# Patient Record
Sex: Female | Born: 1955
Health system: Southern US, Community
[De-identification: ages and names within clinical notes are randomized; demographics above are authoritative.]

## PROBLEM LIST (undated history)

## (undated) DIAGNOSIS — I10 Essential (primary) hypertension: Secondary | ICD-10-CM

## (undated) DIAGNOSIS — E669 Obesity, unspecified: Secondary | ICD-10-CM

## (undated) DIAGNOSIS — R51 Headache: Secondary | ICD-10-CM

## (undated) DIAGNOSIS — G4730 Sleep apnea, unspecified: Secondary | ICD-10-CM

## (undated) HISTORY — DX: Obesity, unspecified: E66.9

## (undated) HISTORY — DX: Essential (primary) hypertension: I10

## (undated) HISTORY — PX: COLONOSCOPY: SHX174

## (undated) HISTORY — DX: Sleep apnea, unspecified: G47.30

---

## 1997-10-24 ENCOUNTER — Inpatient Hospital Stay (HOSPITAL_COMMUNITY): Admission: AD | Admit: 1997-10-24 | Discharge: 1997-10-24 | Payer: Self-pay | Admitting: Obstetrics & Gynecology

## 1997-11-09 ENCOUNTER — Encounter: Admission: RE | Admit: 1997-11-09 | Discharge: 1997-11-09 | Payer: Self-pay | Admitting: Obstetrics & Gynecology

## 1997-12-07 ENCOUNTER — Encounter: Admission: RE | Admit: 1997-12-07 | Discharge: 1997-12-07 | Payer: Self-pay | Admitting: Obstetrics

## 1998-01-04 ENCOUNTER — Other Ambulatory Visit: Admission: RE | Admit: 1998-01-04 | Discharge: 1998-01-04 | Payer: Self-pay | Admitting: Obstetrics & Gynecology

## 1998-01-04 ENCOUNTER — Ambulatory Visit (HOSPITAL_COMMUNITY): Admission: RE | Admit: 1998-01-04 | Discharge: 1998-01-04 | Payer: Self-pay | Admitting: Obstetrics & Gynecology

## 1998-01-04 ENCOUNTER — Encounter: Admission: RE | Admit: 1998-01-04 | Discharge: 1998-01-04 | Payer: Self-pay | Admitting: Obstetrics & Gynecology

## 1998-01-11 ENCOUNTER — Encounter: Admission: RE | Admit: 1998-01-11 | Discharge: 1998-01-11 | Payer: Self-pay | Admitting: Obstetrics & Gynecology

## 1998-01-18 ENCOUNTER — Encounter: Admission: RE | Admit: 1998-01-18 | Discharge: 1998-01-18 | Payer: Self-pay | Admitting: Obstetrics & Gynecology

## 1998-01-24 ENCOUNTER — Encounter: Admission: RE | Admit: 1998-01-24 | Discharge: 1998-01-24 | Payer: Self-pay | Admitting: Internal Medicine

## 1998-02-01 ENCOUNTER — Encounter: Admission: RE | Admit: 1998-02-01 | Discharge: 1998-02-01 | Payer: Self-pay | Admitting: Obstetrics & Gynecology

## 1998-03-08 ENCOUNTER — Encounter: Admission: RE | Admit: 1998-03-08 | Discharge: 1998-03-08 | Payer: Self-pay | Admitting: Obstetrics & Gynecology

## 1998-03-15 ENCOUNTER — Encounter: Admission: RE | Admit: 1998-03-15 | Discharge: 1998-03-15 | Payer: Self-pay | Admitting: Obstetrics & Gynecology

## 1998-06-27 ENCOUNTER — Inpatient Hospital Stay (HOSPITAL_COMMUNITY): Admission: AD | Admit: 1998-06-27 | Discharge: 1998-06-27 | Payer: Self-pay | Admitting: Obstetrics & Gynecology

## 1998-07-18 ENCOUNTER — Other Ambulatory Visit: Admission: RE | Admit: 1998-07-18 | Discharge: 1998-07-18 | Payer: Self-pay | Admitting: Obstetrics and Gynecology

## 1999-02-10 ENCOUNTER — Inpatient Hospital Stay (HOSPITAL_COMMUNITY): Admission: AD | Admit: 1999-02-10 | Discharge: 1999-02-15 | Payer: Self-pay | Admitting: Obstetrics & Gynecology

## 1999-02-10 ENCOUNTER — Encounter (INDEPENDENT_AMBULATORY_CARE_PROVIDER_SITE_OTHER): Payer: Self-pay | Admitting: Specialist

## 1999-02-16 ENCOUNTER — Encounter: Admission: RE | Admit: 1999-02-16 | Discharge: 1999-05-17 | Payer: Self-pay | Admitting: Obstetrics & Gynecology

## 1999-03-16 ENCOUNTER — Other Ambulatory Visit: Admission: RE | Admit: 1999-03-16 | Discharge: 1999-03-16 | Payer: Self-pay | Admitting: Obstetrics & Gynecology

## 2000-03-27 ENCOUNTER — Other Ambulatory Visit: Admission: RE | Admit: 2000-03-27 | Discharge: 2000-03-27 | Payer: Self-pay | Admitting: Obstetrics & Gynecology

## 2000-05-12 ENCOUNTER — Inpatient Hospital Stay (HOSPITAL_COMMUNITY): Admission: AD | Admit: 2000-05-12 | Discharge: 2000-05-12 | Payer: Self-pay | Admitting: Obstetrics and Gynecology

## 2000-05-12 ENCOUNTER — Encounter: Payer: Self-pay | Admitting: Obstetrics and Gynecology

## 2000-09-11 ENCOUNTER — Encounter: Payer: Self-pay | Admitting: Internal Medicine

## 2000-09-11 ENCOUNTER — Encounter: Admission: RE | Admit: 2000-09-11 | Discharge: 2000-09-11 | Payer: Self-pay | Admitting: Internal Medicine

## 2001-04-04 ENCOUNTER — Other Ambulatory Visit: Admission: RE | Admit: 2001-04-04 | Discharge: 2001-04-04 | Payer: Self-pay | Admitting: Obstetrics & Gynecology

## 2001-09-24 ENCOUNTER — Encounter: Payer: Self-pay | Admitting: Internal Medicine

## 2001-09-24 ENCOUNTER — Encounter: Admission: RE | Admit: 2001-09-24 | Discharge: 2001-09-24 | Payer: Self-pay | Admitting: Internal Medicine

## 2002-04-07 ENCOUNTER — Other Ambulatory Visit: Admission: RE | Admit: 2002-04-07 | Discharge: 2002-04-07 | Payer: Self-pay | Admitting: Obstetrics & Gynecology

## 2002-10-21 ENCOUNTER — Ambulatory Visit (HOSPITAL_COMMUNITY): Admission: RE | Admit: 2002-10-21 | Discharge: 2002-10-21 | Payer: Self-pay | Admitting: Gastroenterology

## 2002-10-21 ENCOUNTER — Encounter (INDEPENDENT_AMBULATORY_CARE_PROVIDER_SITE_OTHER): Payer: Self-pay | Admitting: *Deleted

## 2003-04-16 ENCOUNTER — Other Ambulatory Visit: Admission: RE | Admit: 2003-04-16 | Discharge: 2003-04-16 | Payer: Self-pay | Admitting: Obstetrics & Gynecology

## 2003-10-13 ENCOUNTER — Ambulatory Visit (HOSPITAL_COMMUNITY): Admission: RE | Admit: 2003-10-13 | Discharge: 2003-10-13 | Payer: Self-pay | Admitting: Obstetrics & Gynecology

## 2003-10-27 ENCOUNTER — Encounter: Admission: RE | Admit: 2003-10-27 | Discharge: 2003-10-27 | Payer: Self-pay | Admitting: Obstetrics & Gynecology

## 2004-11-03 ENCOUNTER — Encounter: Admission: RE | Admit: 2004-11-03 | Discharge: 2004-11-03 | Payer: Self-pay | Admitting: Internal Medicine

## 2005-11-12 ENCOUNTER — Encounter: Admission: RE | Admit: 2005-11-12 | Discharge: 2005-11-12 | Payer: Self-pay | Admitting: Obstetrics & Gynecology

## 2006-11-21 ENCOUNTER — Encounter: Admission: RE | Admit: 2006-11-21 | Discharge: 2006-11-21 | Payer: Self-pay | Admitting: Obstetrics & Gynecology

## 2007-11-24 ENCOUNTER — Encounter: Admission: RE | Admit: 2007-11-24 | Discharge: 2007-11-24 | Payer: Self-pay | Admitting: Obstetrics & Gynecology

## 2007-11-26 ENCOUNTER — Encounter: Admission: RE | Admit: 2007-11-26 | Discharge: 2007-11-26 | Payer: Self-pay | Admitting: Obstetrics & Gynecology

## 2008-11-30 ENCOUNTER — Encounter: Admission: RE | Admit: 2008-11-30 | Discharge: 2008-11-30 | Payer: Self-pay | Admitting: Obstetrics & Gynecology

## 2009-12-01 ENCOUNTER — Encounter: Admission: RE | Admit: 2009-12-01 | Discharge: 2009-12-01 | Payer: Self-pay | Admitting: Obstetrics & Gynecology

## 2010-03-27 ENCOUNTER — Encounter: Payer: Self-pay | Admitting: Obstetrics & Gynecology

## 2010-07-21 NOTE — Op Note (Signed)
NAME:  Molly Johnson, Molly Johnson                             ACCOUNT NO.:  [id number]   MEDICAL RECORD NO.:  [id number]                   PATIENT TYPE:  AMB   LOCATION:  ENDO                                 FACILITY:  [facility]   PHYSICIAN:  Danise Edge, M.D.                DATE OF BIRTH:  Feb 07, 1956   DATE OF PROCEDURE:  10/21/2002  DATE OF DISCHARGE:                                 OPERATIVE REPORT   PROCEDURES:  1. Esophagogastroduodenoscopy.  2. Colonoscopy.  3. Polypectomy.   PROCEDURE INDICATION:  Ms. Molly Johnson is a 55 year old female, born 04-26-1955.  [name] has iron deficiency anemia.   ENDOSCOPIST:  Danise Edge, M.D.   PREMEDICATION:  1. Versed 10 mg.  2. Demerol 50 mg.   PROCEDURE:  Esophagogastroduodenoscopy.   DESCRIPTION OF PROCEDURE:  After obtaining informed consent, [name] was  placed in the left lateral decubitus position.  I administered intravenous  Demerol and intravenous Versed to achieve conscious sedation for the  procedure.  The patient's blood pressure, oxygen saturation, and cardiac  rhythm were monitored throughout the procedure and documented in the medical  record.   The Olympus gastroscope was passed through the posterior hypopharynx into  the proximal esophagus without difficulty.  The hypopharynx, larynx, and  vocal cords appeared normal.   ESOPHAGOSCOPY:  The proximal, mid, and lower segments of the esophageal  mucosa appear completely normal.   GASTROSCOPY:  Retroflexed view of the gastric cardia and fundus was normal.  The gastric body, antrum, and pylorus appear normal endoscopically.   DUODENOSCOPY:  The duodenal bulb, mid duodenum, and distal duodenum appeared  normal.   ASSESSMENT:  Normal esophagogastroduodenoscopy.   PROCEDURE:  Colonoscopy.   Anal inspection was normal.  Digital rectal exam was normal.  The Olympus  adjustable pediatric colonoscope was introduced into the rectum and advanced  to the cecum.  A  normal-appearing ileocecal valve was intubated and the  distal ileum inspected.  Colonic preparation for the exam today was  excellent.   RECTUM:  Normal.  SIGMOID COLON AND DESCENDING COLON:  At 50 cm from the anal verge, a 2 mm  sessile polyp was removed with the electrocautery, snared, and submitted for  pathological interpretation.  SPLENIC FLEXURE:  Normal.  TRANSVERSE COLON:  Normal.  HEPATIC FLEXURE:  Normal.  ASCENDING COLON:  Normal.  CECUM AND ILEOCECAL VALVE:  Normal.  DISTAL ILEUM:  Normal.   ASSESSMENT:  1. A small polyp was removed from the left colon at 50 cm from the anal     verge and submitted for pathological interpretation.  2. Otherwise, normal proctocolonoscopy to the cecum.   RECOMMENDATIONS:  I suspect [name] has iron deficiency anemia due to  menses.  I recommend placing her on iron and obtaining a serum ferritin in  approximately three months.  Danise Edge, M.D.    MJ/MEDQ  D:  10/21/2002  T:  10/21/2002  Job:  [id number]   cc:   Georgann Housekeeper, M.D.  [address]  [place]  [place] [address]  Fax: [phone]

## 2010-07-21 NOTE — Op Note (Signed)
[facility]  Patient:    Molly Johnson                           MRN: [id number] Proc. Date: 02/12/99 Adm. Date:  [id number] Attending:  Genia Del                           Operative Report  DATE OF BIRTH:                June 15, 1955  PREOPERATIVE DIAGNOSIS:       Forty weeks, group B Streptococcus positive,                               suspicion of macrosomia, pregnancy-induced                               hypertension, failure to progress with probable                               fetopelvic disproportion, chorioamnionitis.  POSTOPERATIVE DIAGNOSIS:      Forty weeks, group B Streptococcus positive,                               suspicion of macrosomia, pregnancy-induced                               hypertension, failure to progress with probable                               fetopelvic disproportion, chorioamnionitis plus                               bilateral ovarian cysts.  OPERATION:                    Under epidural, primary urgent low transverse                               C-section.  SURGEON:                      Genia Del, M.D.  ASSISTANT:                    Pershing Cox, M.D.  ANESTHETIST:                  Raul Del, M.D.  ANESTHESIA:                   Epidural.  DESCRIPTION OF PROCEDURE:     Under epidural with the patient in 15 degree left  decubitus position, the patient was prepped with Betadine in the abdominal, suprapubic and vulvar areas.  The bladder catheter ws in place and the drapes were withdrawn as usual.  We verified the epidural.  The level was good.  We did a Pfannenstiel incision with the scalpel.  We opened the aponeurosis transversely  with the Mayo scissors  and then removed the aponeurosis from the recti muscles superiorly and inferiorly.  We then opened the peritoneum longitudinally with the Metzenbaum scissors.  Then, the visceroperitoneum was opened  longitudinally with the Metzenbaum scissors.  The bladder was reclined downward and hysterotomy was  done in the lower uterine segment transversely.  The incision was prolonged bilaterally with the scissors.  The head was very high in the pelvis.  It was molded and a caput is present.  The baby was born at 2.  It was a female baby.  The cord was clamped and cut.  The baby was given to the neonatologist.  The Apgars were 8 and 9.  The weight was 8 pounds and 12 ounces.  Blood was taken from the  cord.  The placenta was removed manually, and revision of the uterus was done with a lap.  Pitocin was started in the IV fluids.  The uterus contracted well.  We closed the hysterotomy in one total plan by a running suture with 0 Monocryl. e then completed the hemostasis with two sutures in X with 0 Monocryl.  The hemostasis was adequate.  We verified the pericolic gutters bilaterally.  We then examined the adnexa.  The two tubes were normal.  The two ovaries looked similar with benign appearing cysts measuring 4 cm on each side.  This will be investigated in the postoperative period at the postoperative visit in 4-6 weeks from now with an ultrasound.  We then verified hemostasis on the recti muscles.  It was completed where needed with electrocautery.  We closed the aponeurosis by two _______ running sutures with 0 Vicryl.  We then achieved hemostasis on the adipose tissue with the electrocautery and reapproximated the skin with staples.  A clean dry dressing as applied on the incision.  The count of compresses and instrument was adequate x 2 and complete x 2.  Estimated blood loss was 700 cc.  No complications occurred nd the patient was transferred to the recovery room in good status.  She will be receiving clindamycin 900 mg q.8h. IV for 24 hours __________. DD:  02/12/99 TD:  02/13/99 Job: [id number]

## 2010-07-21 NOTE — Op Note (Signed)
NAME:  Molly Johnson, BERQUIST                             ACCOUNT NO.:  [id number]   MEDICAL RECORD NO.:  [id number]                   PATIENT TYPE:  AMB   LOCATION:  SDC                                  FACILITY:  [facility]   PHYSICIAN:  Genia Del, M.D.             DATE OF BIRTH:  [birth year]   DATE OF PROCEDURE:  10/13/2003  DATE OF DISCHARGE:                                 OPERATIVE REPORT   The patient came in as an outpatient surgery at [facility] on October 13, 2003.   PREOPERATIVE DIAGNOSIS:  Desire for bilateral tubal sterilization and  menorrhagia.   POSTOPERATIVE DIAGNOSIS:  Desire for bilateral tubal sterilization and  menorrhagia.  Status post uterine suspension.   OPERATION PERFORMED:  Open laparoscopy with bilateral tubal sterilization by  cauterization and NovaSure endometrial ablation.   SURGEON:  Genia Del, M.D.   ANESTHESIOLOGIST:  Quillian Quince, M.D.   ANESTHESIA:  General endotracheal.   DESCRIPTION OF PROCEDURE:  Under general anesthesia with endotracheal  intubation, the patient was in lithotomy position for operative laparoscopy.  She was prepped with Hibiclens on the abdominal suprapubic, vulvar and  vaginal areas.  The bladder was catheterized and the patient was draped as  usual.  The vaginal exam revealed an __________ uterus, normal volume, no  adnexal mass.  The speculum was introduced into the vagina and an  intrauterine cannula was put in place with one toothed tenaculum.  We  removed the speculum.  At the __________ the subcutaneous tissue at the  infraumbilical area was infiltrated with Marcaine 0.25% plain 10 mL.  We  then made an infraumbilical incision with a scalpel at the site of the  previous scar.  We opened the aponeurosis after grasping it with Kocher  clamps with Mayo scissors.  We then ran a Vicryl 0 stitch in a pursestring  fashion that level.  We then opened the parietal peritoneum bluntly with a  finger.  The Hasson was  inserted in the abdominal cavity under direct  vision.  We then inserted a laparoscope at that level.  We visualized the  abdominal pelvic cavity.  No lesions are seen in the abdominal cavity.  A  picture was taken of the liver.  We then visualized the pelvic cavity.  The  uterus was normal in appearance except for a previous uterine suspension  bringing a thick adhesion from the uterus to the anterior wall.  The round  ligaments were going to the anterior wall.  Both tubes were normal in  appearance. Both ovaries were normal in size and appearance.  No pelvic  lesion was seen.  We then because of the anterior uterine suspension, we  could not access the tubes easily and we could not put a suprapubic trocar.  We therefore, decided to insert a 5 mm trocar on the left iliac area.  We  infiltrated the subcutaneous tissue at  that level with Marcaine 0.25% plain  6 mL.  We then made a 5 mm incision with a scalpel and inserted a 5 mm  trocar under direct vision.  We used the bipolar at that level.  The tubes  were exposed on each side.  We started on the right side identifying the  fimbria well. We cauterized the right tube at about 2 cm from the cornua in  its entire width including part of the mesosalpinx.  We cauterized again,  slightly more distally and then in between.  We proceeded the same way on  left side.  Pictures were taken before and after the intervention.  A small  superficial bleeding was seen at the fundus of the uterus and this  cauterized with the bipolar.  Hemostasis was adequate.  We irrigated and  suctioned the pelvic cavity.  We removed instruments and left the trocars in  place in case any complication were to occur during the NovaSure endometrial  ablation.  We covered the abdomen with a drape.  We positioned the patient  in full lithotomy position, we inserted the speculum in the vagina.  We then  grasped the anterior lip of the cervix with a tenaculum after removing  the  cannula.  We did a paracervical block with lidocaine 1% a total of 20 mL at  4 and 8 o'clock.  We then made the measurements of the cervical length and  intrauterine cavity length.  We dilated the cervix with Hegar dilators up to  #25 without difficulty.  We completed the measurements and the testing with  the NovaSure instrument and proceeded with the endometrial ablation. The  uterine cavity length was 6.5 cm and width was 4.6 cm.  No complication and  no difficulty was encountered.  We therefore removed all instruments  vaginally.  Hemostasis was adequate. We went abdominally after changing  gloves and removed the trocars after evacuating the CO2 completely.  We  attached the Vicryl 0 suture at the aponeurosis making sure that no  abdominal content was present at that level.  We then closed the skin at the  infraumbilical incision with a Vicryl 4-0 in a subcuticular stitch.  Hemostasis was adequate.  We closed the 5 mm incision of the left iliac area  with Dermabond.  Hemostasis was good at that level as well.  Dermabond was  also used at the infraumbilical incision on top of the Vicryl subcuticular  suture.  The count of instruments and sponges was complete.  The estimated  blood loss was minimal.  No complication occurred and the patient was  transferred to recovery room in good status.                                               Genia Del, M.D.    ML/MEDQ  D:  10/13/2003  T:  10/13/2003  Job:  [id number]

## 2010-10-31 ENCOUNTER — Other Ambulatory Visit: Payer: Self-pay | Admitting: Obstetrics & Gynecology

## 2010-10-31 DIAGNOSIS — Z1231 Encounter for screening mammogram for malignant neoplasm of breast: Secondary | ICD-10-CM

## 2010-12-05 ENCOUNTER — Ambulatory Visit
Admission: RE | Admit: 2010-12-05 | Discharge: 2010-12-05 | Disposition: A | Discharge status: Home / Self Care | Payer: Self-pay | Source: Ambulatory Visit | Attending: Obstetrics & Gynecology | Admitting: Obstetrics & Gynecology

## 2010-12-05 DIAGNOSIS — Z1231 Encounter for screening mammogram for malignant neoplasm of breast: Secondary | ICD-10-CM

## 2011-11-06 ENCOUNTER — Other Ambulatory Visit: Payer: Self-pay | Admitting: Internal Medicine

## 2011-11-06 DIAGNOSIS — Z1231 Encounter for screening mammogram for malignant neoplasm of breast: Secondary | ICD-10-CM

## 2011-12-06 ENCOUNTER — Ambulatory Visit
Admission: RE | Admit: 2011-12-06 | Discharge: 2011-12-06 | Disposition: A | Discharge status: Home / Self Care | Payer: 59 | Source: Ambulatory Visit | Attending: Internal Medicine | Admitting: Internal Medicine

## 2011-12-06 DIAGNOSIS — Z1231 Encounter for screening mammogram for malignant neoplasm of breast: Secondary | ICD-10-CM

## 2011-12-17 ENCOUNTER — Encounter: Payer: Self-pay | Admitting: *Deleted

## 2011-12-17 ENCOUNTER — Encounter: Payer: 59 | Attending: Internal Medicine | Admitting: *Deleted

## 2011-12-17 DIAGNOSIS — E669 Obesity, unspecified: Secondary | ICD-10-CM | POA: Insufficient documentation

## 2011-12-17 DIAGNOSIS — Z713 Dietary counseling and surveillance: Secondary | ICD-10-CM | POA: Insufficient documentation

## 2011-12-17 DIAGNOSIS — I10 Essential (primary) hypertension: Secondary | ICD-10-CM | POA: Insufficient documentation

## 2011-12-17 NOTE — Patient Instructions (Addendum)
Plan: Aim for 3 Carb choices (45 grams) per meal +/- 1 either way Aim for 0-2 Carbs per snack if hungry Continue with appropriate meat serving sizes Consider reading food labels for total carb of foods Consider resuming WII exercises for 5-15 minutes daily

## 2011-12-17 NOTE — Progress Notes (Signed)
  Medical Nutrition Therapy:  Appt start time: 1115 end time:  1215.  Assessment:  Primary concerns today: patient here for obesity. She does not work outside the home and lives with her husband and 55 year old son. She eats out at [employer] daily for lunch, prepares the evening meal for her family. She was walking earlier this year but fell on uneven sidewalk so stopped that. She has exercised with the WII in her home but stopped when she got sore one time. Current activity is routine housework. Her husband works out often at gym at his work.   MEDICATIONS: see list   DIETARY INTAKE:  Usual eating pattern includes 3 meals and 0-2 snacks per day.  Everyday foods include good variety of all food groups.  Avoided foods include pork due to HTN, diet sodas.    24-hr recall:  B ( AM): skips occasionally, blueberry muffin, 6 oz OJ OR hot green tea with honey Snk ( AM): none  L ( PM): fast food; 2 pieces of grilled chicken, vegetable, occasionally potatoes or cole slaw, biscuit, sweet tea or regular Pepsi Snk ( PM): variety of foods; chips usually on paper towel, fresh fruit, trail mix D ( PM): meat, starch, occasionally a vegetable, water Snk ( PM): ice cream Beverages: hot tea, OJ, regular soda, sweet tea, water  Usual physical activity: house work daily, used to work out with WII at home,   Estimated energy needs: 1600 calories 180 g carbohydrates 120 g protein 44 g fat  Progress Towards Goal(s):  In progress.   Nutritional Diagnosis:  NI-1.5 Excessive energy intake As related to activity level.  As evidenced by BMI of 41.8.    Intervention:  Nutrition counseling initiated for weight loss. Discussed calorie content of macro-nutrients and taught Carb Counting to get her started in being more aware of where her calories were coming from. Also discussed reading food labels and rationale of being active every day. Plan: Aim for 3 Carb choices (45 grams) per meal +/- 1 either  way Aim for 0-2 Carbs per snack if hungry Continue with appropriate meat serving sizes Consider reading food labels for total carb of foods Consider resuming WII exercises for 5-15 minutes daily  Handouts given during visit include: Carb Counting and Food Label handouts Meal Plan Card  APPS to use including Carbscontrol for Android phone and The Seven Minute Workout  Monitoring/Evaluation:  Dietary intake, exercise, reading food labels, and body weight prn.

## 2012-05-29 ENCOUNTER — Ambulatory Visit (INDEPENDENT_AMBULATORY_CARE_PROVIDER_SITE_OTHER): Payer: Self-pay | Admitting: General Surgery

## 2012-06-03 ENCOUNTER — Encounter (INDEPENDENT_AMBULATORY_CARE_PROVIDER_SITE_OTHER): Payer: Self-pay | Admitting: General Surgery

## 2012-06-03 ENCOUNTER — Ambulatory Visit (INDEPENDENT_AMBULATORY_CARE_PROVIDER_SITE_OTHER): Payer: 59 | Admitting: General Surgery

## 2012-06-03 VITALS — BP 130/74 | HR 76 | Temp 98.0°F | Resp 18 | Ht 62.0 in | Wt 228.0 lb

## 2012-06-03 DIAGNOSIS — R1900 Intra-abdominal and pelvic swelling, mass and lump, unspecified site: Secondary | ICD-10-CM

## 2012-06-03 NOTE — Progress Notes (Signed)
Patient ID: Molly Johnson, female   DOB: August 12, 1955, 57 y.o.   MRN: [id number]  Chief Complaint  Patient presents with  . New Evaluation    cyst left hip    HPI Molly Johnson is a 57 y.o. female.  Referred by Dr Donette Larry HPI 44 yof who presents with a years history of a left flank mass that has been enlarging. This has never been infected, drained or has drained on its own.  It has been getting bigger and is now difficult to wear clothes. She comes in and would like this removed.  Past Medical History  Diagnosis Date  . Hypertension   . Obesity     Past Surgical History  Procedure Laterality Date  . Cesarean section      Family History  Problem Relation Age of Onset  . Diabetes Mother     Social History History  Substance Use Topics  . Smoking status: Former Smoker    Types: Cigarettes    Quit date: 12/16/1988  . Smokeless tobacco: Never Used  . Alcohol Use: No    Allergies  Allergen Reactions  . Aspirin Anaphylaxis    Upsets her stomach    Current Outpatient Prescriptions  Medication Sig Dispense Refill  . Cholecalciferol (VITAMIN D3) 1000 UNITS CAPS Take by mouth.      . metoprolol succinate (TOPROL-XL) 50 MG 24 hr tablet Take 50 mg by mouth daily. Take with or immediately following a meal.      . triamterene-hydrochlorothiazide (DYAZIDE) 37.5-25 MG per capsule Take 1 capsule by mouth every morning.       No current facility-administered medications for this visit.    Review of Systems Review of Systems  Constitutional: Negative for fever, chills and unexpected weight change.  HENT: Negative for hearing loss, congestion, sore throat, trouble swallowing and voice change.   Eyes: Negative for visual disturbance.  Respiratory: Negative for cough and wheezing.   Cardiovascular: Negative for chest pain, palpitations and leg swelling.  Gastrointestinal: Negative for nausea, vomiting, abdominal pain, diarrhea, constipation, blood in stool, abdominal distention and anal  bleeding.  Genitourinary: Negative for hematuria, vaginal bleeding and difficulty urinating.  Musculoskeletal: Negative for arthralgias.  Skin: Negative for rash and wound.  Neurological: Positive for headaches. Negative for seizures and syncope.  Hematological: Negative for adenopathy. Bruises/bleeds easily.  Psychiatric/Behavioral: Negative for confusion.    Blood pressure 130/74, pulse 76, temperature 98 F (36.7 C), resp. rate 18, height 5\' 2"  (1.575 m), weight 228 lb (103.42 kg).  Physical Exam Physical Exam  Vitals reviewed. Constitutional: She appears well-developed and well-nourished.  Cardiovascular: Normal rate, regular rhythm and normal heart sounds.   Pulmonary/Chest: Effort normal and breath sounds normal. She has no wheezes. She has no rales.  Abdominal:    Lymphadenopathy:    She has no cervical adenopathy.    Data Reviewed Note from PCP  Assessment    Likely left flank lipoma     Plan    This has gotten bigger and is more symptomatic.  It is difficult to wear clothes. We discussed excision with possible drain.  Risks include but not limited to bleeding, infection recurrence although unlikely.          Hina Gupta 06/03/2012, 1:27 PM

## 2012-06-27 ENCOUNTER — Encounter (HOSPITAL_COMMUNITY): Payer: Self-pay | Admitting: Pharmacy Technician

## 2012-07-02 ENCOUNTER — Ambulatory Visit (HOSPITAL_COMMUNITY)
Admission: RE | Admit: 2012-07-02 | Discharge: 2012-07-02 | Disposition: A | Discharge status: Home / Self Care | Payer: 59 | Source: Ambulatory Visit | Attending: General Surgery | Admitting: General Surgery

## 2012-07-02 ENCOUNTER — Encounter (HOSPITAL_COMMUNITY): Payer: Self-pay

## 2012-07-02 ENCOUNTER — Encounter (HOSPITAL_COMMUNITY)
Admission: RE | Admit: 2012-07-02 | Discharge: 2012-07-02 | Disposition: A | Discharge status: Home / Self Care | Payer: 59 | Source: Ambulatory Visit | Attending: General Surgery | Admitting: General Surgery

## 2012-07-02 DIAGNOSIS — Z01812 Encounter for preprocedural laboratory examination: Secondary | ICD-10-CM | POA: Insufficient documentation

## 2012-07-02 DIAGNOSIS — Z01810 Encounter for preprocedural cardiovascular examination: Secondary | ICD-10-CM | POA: Insufficient documentation

## 2012-07-02 DIAGNOSIS — Z01818 Encounter for other preprocedural examination: Secondary | ICD-10-CM | POA: Insufficient documentation

## 2012-07-02 DIAGNOSIS — I10 Essential (primary) hypertension: Secondary | ICD-10-CM | POA: Insufficient documentation

## 2012-07-02 HISTORY — DX: Headache: R51

## 2012-07-02 LAB — BASIC METABOLIC PANEL
BUN: 12 mg/dL (ref 6–23)
Chloride: 101 mEq/L (ref 96–112)
Creatinine, Ser: 0.84 mg/dL (ref 0.50–1.10)
GFR calc Af Amer: 89 mL/min — ABNORMAL LOW (ref >90)
GFR calc non Af Amer: 77 mL/min — ABNORMAL LOW (ref >90)

## 2012-07-02 LAB — CBC WITH DIFFERENTIAL/PLATELET
Basophils Absolute: 0 10*3/uL (ref 0.0–0.1)
Basophils Relative: 0 % (ref 0–1)
HCT: 39.5 % (ref 36.0–46.0)
Lymphocytes Relative: 34 % (ref 12–46)
MCHC: 33.2 g/dL (ref 30.0–36.0)
Monocytes Absolute: 0.6 10*3/uL (ref 0.1–1.0)
Neutro Abs: 2.7 10*3/uL (ref 1.7–7.7)
Neutrophils Relative %: 52 % (ref 43–77)
Platelets: 159 10*3/uL (ref 150–400)
RDW: 13 % (ref 11.5–15.5)
WBC: 5.2 10*3/uL (ref 4.0–10.5)

## 2012-07-02 LAB — SURGICAL PCR SCREEN
MRSA, PCR: NEGATIVE
Staphylococcus aureus: NEGATIVE

## 2012-07-02 NOTE — Progress Notes (Signed)
07/02/12 1011  OBSTRUCTIVE SLEEP APNEA  Have you ever been diagnosed with sleep apnea through a sleep study? No  Do you snore loudly (loud enough to be heard through closed doors)?  1  Do you often feel tired, fatigued, or sleepy during the daytime? 0  Has anyone observed you stop breathing during your sleep? 0  Do you have, or are you being treated for high blood pressure? 1  BMI more than 35 kg/m2? 1  Age over 56 years old? 1  Neck circumference greater than 40 cm/18 inches? 0  Gender: 0  Obstructive Sleep Apnea Score 4  Score 4 or greater  Results sent to PCP

## 2012-07-02 NOTE — Patient Instructions (Addendum)
20 DUSTINA SCOGGIN  07/02/2012   Your procedure is scheduled on: 07/09/12  Report to [facility] at 0600 AM.  Call this number if you have problems the morning of surgery [phone]   Remember:   Do not eat food or drink liquids After Midnight.   Do not wear jewelry, make-up or nail polish.  Do not wear lotions, powders, or perfumes. You may wear deodorant.  Do not shave 48 hours prior to surgery. Men may shave face and neck.  Do not bring valuables to the hospital.  Contacts, dentures or bridgework may not be worn into surgery.     Patients discharged the day of surgery will not be allowed to drive home.  Name and phone number of your driver: Laykin Rainone [phone]     Please read over the following fact sheets that you were given: MRSA Information.  Birdie Sons, RN  pre op nurse call if needed [phone]    FAILURE TO FOLLOW THESE INSTRUCTIONS MAY RESULT IN CANCELLATION OF YOUR SURGERY   Patient Signature: ___________________________________________

## 2012-07-08 NOTE — Anesthesia Preprocedure Evaluation (Addendum)
Anesthesia Evaluation  Patient identified by MRN, date of birth, ID band Patient awake    Reviewed: Allergy & Precautions, H&P , NPO status , Patient's Chart, lab work & pertinent test results, reviewed documented beta blocker date and time   History of Anesthesia Complications (+) MALIGNANT HYPERTHERMIA  Airway Mallampati: II TM Distance: >3 FB Neck ROM: full    Dental  (+) Missing and Dental Advisory Given Most of the front teeth are missing.  None loose:   Pulmonary neg pulmonary ROS,  breath sounds clear to auscultation  Pulmonary exam normal       Cardiovascular Exercise Tolerance: Good hypertension, Pt. on home beta blockers Rhythm:regular Rate:Normal     Neuro/Psych negative neurological ROS  negative psych ROS   GI/Hepatic negative GI ROS, Neg liver ROS,   Endo/Other  negative endocrine ROSMorbid obesity  Renal/GU negative Renal ROS  negative genitourinary   Musculoskeletal   Abdominal (+) + obese,   Peds  Hematology negative hematology ROS (+)   Anesthesia Other Findings   Reproductive/Obstetrics negative OB ROS                        Anesthesia Physical Anesthesia Plan  ASA: III  Anesthesia Plan: General   Post-op Pain Management:    Induction: Intravenous  Airway Management Planned: LMA  Additional Equipment:   Intra-op Plan:   Post-operative Plan:   Informed Consent: I have reviewed the patients History and Physical, chart, labs and discussed the procedure including the risks, benefits and alternatives for the proposed anesthesia with the patient or authorized representative who has indicated his/her understanding and acceptance.   Dental Advisory Given  Plan Discussed with: CRNA and Surgeon  Anesthesia Plan Comments:        Anesthesia Quick Evaluation

## 2012-07-09 ENCOUNTER — Ambulatory Visit (HOSPITAL_COMMUNITY)
Admission: RE | Admit: 2012-07-09 | Discharge: 2012-07-09 | Disposition: A | Discharge status: Home / Self Care | Payer: 59 | Source: Ambulatory Visit | Attending: General Surgery | Admitting: General Surgery

## 2012-07-09 ENCOUNTER — Encounter (HOSPITAL_COMMUNITY)
Admission: RE | Disposition: A | Discharge status: Home / Self Care | Payer: Self-pay | Source: Ambulatory Visit | Attending: General Surgery

## 2012-07-09 ENCOUNTER — Ambulatory Visit (HOSPITAL_COMMUNITY): Payer: 59 | Admitting: Anesthesiology

## 2012-07-09 ENCOUNTER — Encounter (HOSPITAL_COMMUNITY): Payer: Self-pay | Admitting: *Deleted

## 2012-07-09 ENCOUNTER — Encounter (HOSPITAL_COMMUNITY): Payer: Self-pay | Admitting: Anesthesiology

## 2012-07-09 DIAGNOSIS — I10 Essential (primary) hypertension: Secondary | ICD-10-CM | POA: Insufficient documentation

## 2012-07-09 DIAGNOSIS — R1909 Other intra-abdominal and pelvic swelling, mass and lump: Secondary | ICD-10-CM | POA: Insufficient documentation

## 2012-07-09 DIAGNOSIS — D1779 Benign lipomatous neoplasm of other sites: Secondary | ICD-10-CM | POA: Insufficient documentation

## 2012-07-09 DIAGNOSIS — E669 Obesity, unspecified: Secondary | ICD-10-CM | POA: Insufficient documentation

## 2012-07-09 DIAGNOSIS — Z79899 Other long term (current) drug therapy: Secondary | ICD-10-CM | POA: Insufficient documentation

## 2012-07-09 DIAGNOSIS — D1739 Benign lipomatous neoplasm of skin and subcutaneous tissue of other sites: Secondary | ICD-10-CM

## 2012-07-09 HISTORY — PX: MASS EXCISION: SHX2000

## 2012-07-09 SURGERY — EXCISION MASS
Anesthesia: General | Site: Flank | Laterality: Left | Wound class: Clean

## 2012-07-09 MED ORDER — CEFAZOLIN SODIUM-DEXTROSE 2-3 GM-% IV SOLR
INTRAVENOUS | Status: AC
  Filled 2012-07-09: qty 50

## 2012-07-09 MED ORDER — LIDOCAINE HCL 1 % IJ SOLN
INTRAMUSCULAR | Status: AC
  Filled 2012-07-09: qty 20

## 2012-07-09 MED ORDER — CEFAZOLIN SODIUM-DEXTROSE 2-3 GM-% IV SOLR
2.0000 g | INTRAVENOUS | Status: AC
  Administered 2012-07-09: 2 g via INTRAVENOUS

## 2012-07-09 MED ORDER — 0.9 % SODIUM CHLORIDE (POUR BTL) OPTIME
TOPICAL | Status: DC | PRN
  Administered 2012-07-09: 1000 mL

## 2012-07-09 MED ORDER — LIDOCAINE HCL (CARDIAC) 20 MG/ML IV SOLN
INTRAVENOUS | Status: DC | PRN
  Administered 2012-07-09: 100 mg via INTRAVENOUS

## 2012-07-09 MED ORDER — LACTATED RINGERS IV SOLN
INTRAVENOUS | Status: DC | PRN
  Administered 2012-07-09: 08:00:00 via INTRAVENOUS

## 2012-07-09 MED ORDER — BUPIVACAINE HCL (PF) 0.25 % IJ SOLN
INTRAMUSCULAR | Status: DC | PRN
  Administered 2012-07-09: 10 mL

## 2012-07-09 MED ORDER — LIDOCAINE HCL (PF) 1 % IJ SOLN
INTRAMUSCULAR | Status: DC | PRN
  Administered 2012-07-09: 10 mL

## 2012-07-09 MED ORDER — ONDANSETRON HCL 4 MG/2ML IJ SOLN
INTRAMUSCULAR | Status: DC | PRN
  Administered 2012-07-09: 4 mg via INTRAVENOUS

## 2012-07-09 MED ORDER — LACTATED RINGERS IV SOLN: INTRAVENOUS | Status: DC

## 2012-07-09 MED ORDER — FENTANYL CITRATE 0.05 MG/ML IJ SOLN
INTRAMUSCULAR | Status: DC | PRN
  Administered 2012-07-09: 50 ug via INTRAVENOUS
  Administered 2012-07-09 (×2): 25 ug via INTRAVENOUS

## 2012-07-09 MED ORDER — OXYCODONE-ACETAMINOPHEN 5-325 MG PO TABS: 1.0000 | ORAL_TABLET | ORAL | Status: DC | PRN

## 2012-07-09 MED ORDER — FENTANYL CITRATE 0.05 MG/ML IJ SOLN: 25.0000 ug | INTRAMUSCULAR | Status: DC | PRN

## 2012-07-09 MED ORDER — PROPOFOL 10 MG/ML IV BOLUS
INTRAVENOUS | Status: DC | PRN
  Administered 2012-07-09: 200 mg via INTRAVENOUS

## 2012-07-09 MED ORDER — PHENYLEPHRINE HCL 10 MG/ML IJ SOLN
INTRAMUSCULAR | Status: DC | PRN
  Administered 2012-07-09: 40 ug via INTRAVENOUS
  Administered 2012-07-09: 80 ug via INTRAVENOUS
  Administered 2012-07-09: 40 ug via INTRAVENOUS

## 2012-07-09 MED ORDER — MIDAZOLAM HCL 5 MG/5ML IJ SOLN
INTRAMUSCULAR | Status: DC | PRN
  Administered 2012-07-09: 2 mg via INTRAVENOUS

## 2012-07-09 MED ORDER — BUPIVACAINE HCL (PF) 0.25 % IJ SOLN
INTRAMUSCULAR | Status: AC
  Filled 2012-07-09: qty 30

## 2012-07-09 SURGICAL SUPPLY — 32 items
BLADE SURG 15 STRL LF DISP TIS (BLADE) ×1 IMPLANT
BLADE SURG 15 STRL SS (BLADE) ×1
BLADE SURG ROTATE 9660 (MISCELLANEOUS) IMPLANT
CANISTER SUCTION 1200CC (MISCELLANEOUS) IMPLANT
CHLORAPREP W/TINT 26ML (MISCELLANEOUS) ×2 IMPLANT
CLOTH BEACON ORANGE TIMEOUT ST (SAFETY) ×2 IMPLANT
DECANTER SPIKE VIAL GLASS SM (MISCELLANEOUS) IMPLANT
DERMABOND ADVANCED (GAUZE/BANDAGES/DRESSINGS) ×1
DERMABOND ADVANCED .7 DNX12 (GAUZE/BANDAGES/DRESSINGS) ×1 IMPLANT
DRAPE PED LAPAROTOMY (DRAPES) ×2 IMPLANT
ELECT REM PT RETURN 9FT ADLT (ELECTROSURGICAL) ×2
ELECTRODE REM PT RTRN 9FT ADLT (ELECTROSURGICAL) ×1 IMPLANT
GLOVE BIO SURGEON STRL SZ7 (GLOVE) ×2 IMPLANT
GLOVE BIOGEL PI IND STRL 7.5 (GLOVE) ×1 IMPLANT
GLOVE BIOGEL PI INDICATOR 7.5 (GLOVE) ×1
GOWN PREVENTION PLUS XLARGE (GOWN DISPOSABLE) ×2 IMPLANT
KIT BASIN OR (CUSTOM PROCEDURE TRAY) ×2 IMPLANT
NEEDLE HYPO 25X1 1.5 SAFETY (NEEDLE) ×2 IMPLANT
NS IRRIG 1000ML POUR BTL (IV SOLUTION) IMPLANT
PACK BASIC VI WITH GOWN DISP (CUSTOM PROCEDURE TRAY) IMPLANT
PENCIL BUTTON HOLSTER BLD 10FT (ELECTRODE) ×2 IMPLANT
SPONGE GAUZE 4X4 12PLY (GAUZE/BANDAGES/DRESSINGS) ×2 IMPLANT
STRIP CLOSURE SKIN 1/2X4 (GAUZE/BANDAGES/DRESSINGS) ×4 IMPLANT
SUT MNCRL AB 4-0 PS2 18 (SUTURE) ×2 IMPLANT
SUT VIC AB 3-0 SH 8-18 (SUTURE) ×2 IMPLANT
SYR CONTROL 10ML LL (SYRINGE) ×2 IMPLANT
TAPE CLOTH SURG 4X10 WHT LF (GAUZE/BANDAGES/DRESSINGS) ×2 IMPLANT
TOWEL OR 17X26 10 PK STRL BLUE (TOWEL DISPOSABLE) ×4 IMPLANT
TOWEL OR NON WOVEN STRL DISP B (DISPOSABLE) ×2 IMPLANT
WATER STERILE IRR 1000ML POUR (IV SOLUTION) ×2 IMPLANT
YANKAUER SUCT BULB TIP 10FT TU (MISCELLANEOUS) IMPLANT
YANKAUER SUCT BULB TIP NO VENT (SUCTIONS) IMPLANT

## 2012-07-09 NOTE — H&P (Signed)
  75 yof who presents with a years history of a left flank mass that has been enlarging. This has never been infected, drained or has drained on its own. It has been getting bigger and is now difficult to wear clothes. We discussed removal.  There has been no change since our last visit.  Past Medical History   Diagnosis  Date   .  Hypertension    .  Obesity     Past Surgical History   Procedure  Laterality  Date   .  Cesarean section      Family History   Problem  Relation  Age of Onset   .  Diabetes  Mother    Social History  History   Substance Use Topics   .  Smoking status:  Former Smoker     Types:  Cigarettes     Quit date:  12/16/1988   .  Smokeless tobacco:  Never Used   .  Alcohol Use:  No    Allergies   Allergen  Reactions   .  Aspirin  Anaphylaxis     Upsets her stomach    Current Outpatient Prescriptions   Medication  Sig  Dispense  Refill   .  Cholecalciferol (VITAMIN D3) 1000 UNITS CAPS  Take by mouth.     .  metoprolol succinate (TOPROL-XL) 50 MG 24 hr tablet  Take 50 mg by mouth daily. Take with or immediately following a meal.     .  triamterene-hydrochlorothiazide (DYAZIDE) 37.5-25 MG per capsule  Take 1 capsule by mouth every morning.      No current facility-administered medications for this visit.   Review of Systems  Review of Systems  Constitutional: Negative for fever, chills and unexpected weight change.  HENT: Negative for hearing loss, congestion, sore throat, trouble swallowing and voice change.  Eyes: Negative for visual disturbance.  Respiratory: Negative for cough and wheezing.  Cardiovascular: Negative for chest pain, palpitations and leg swelling.  Gastrointestinal: Negative for nausea, vomiting, abdominal pain, diarrhea, constipation, blood in stool, abdominal distention and anal bleeding.  Genitourinary: Negative for hematuria, vaginal bleeding and difficulty urinating.  Musculoskeletal: Negative for arthralgias.  Skin: Negative for  rash and wound.  Neurological: Positive for headaches. Negative for seizures and syncope.  Hematological: Negative for adenopathy. Bruises/bleeds easily.  Psychiatric/Behavioral: Negative for confusion.  Blood pressure 130/74, pulse 76, temperature 98 F (36.7 C), resp. rate 18, height 5\' 2"  (1.575 m), weight 228 lb (103.42 kg).  Physical Exam  Physical Exam  Vitals reviewed.  Constitutional: She appears well-developed and well-nourished.  Cardiovascular: Normal rate, regular rhythm and normal heart sounds.  Pulmonary/Chest: Effort normal and breath sounds normal. She has no wheezes. She has no rales.  Abdominal:     Assessment  Likely left flank lipoma  Plan  This has gotten bigger and is more symptomatic. It is difficult to wear clothes. We discussed excision with possible drain. Risks include but not limited to bleeding, infection recurrence although unlikely.

## 2012-07-09 NOTE — Transfer of Care (Signed)
Immediate Anesthesia Transfer of Care Note  Patient: Molly Johnson  Procedure(s) Performed: Procedure(s): EXCISION MASS left flank (Left)  Patient Location: PACU  Anesthesia Type:General  Level of Consciousness: awake, alert , oriented and patient cooperative  Airway & Oxygen Therapy: Patient Spontanous Breathing and Patient connected to face mask oxygen  Post-op Assessment: Report given to PACU RN, Post -op Vital signs reviewed and stable and Patient moving all extremities  Post vital signs: Reviewed and stable  Complications: No apparent anesthesia complications

## 2012-07-09 NOTE — Anesthesia Postprocedure Evaluation (Signed)
  Anesthesia Post-op Note  Patient: Molly Johnson  Procedure(s) Performed: Procedure(s) (LRB): EXCISION MASS left flank (Left)  Patient Location: PACU  Anesthesia Type: General  Level of Consciousness: awake and alert   Airway and Oxygen Therapy: Patient Spontanous Breathing  Post-op Pain: mild  Post-op Assessment: Post-op Vital signs reviewed, Patient's Cardiovascular Status Stable, Respiratory Function Stable, Patent Airway and No signs of Nausea or vomiting  Last Vitals:  Filed Vitals:   07/09/12 1013  BP: 135/72  Pulse: 76  Temp: 36.8 C  Resp: 14    Post-op Vital Signs: stable   Complications: No apparent anesthesia complications

## 2012-07-09 NOTE — Preoperative (Signed)
Beta Blockers   Reason not to administer Beta Blockers:Metorpolol taken 2200 on 07-08-12

## 2012-07-09 NOTE — Op Note (Signed)
Preoperative diagnosis: Left flank lipoma Postoperative diagnosis: same as above Procedure: Excision of left flank mass, 12x10 cm subq Surgeon: Dr Harden Mo Anesthesia: General with LMA EBL: minimal Drains: none Complications: none Specimen: left flank mass to pathology Sponge and needle count correct times two Disposition to recovery stable  Indications: This is a 27 yof with longstanding left flank mass that has been increasing in size and becoming symptomatic.  She was seen and desired excision.We discussed excision in OR due to size and risks/benefits associated with that.  Procedure:  After informed consent was obtained, the patient was taken to the operating room. She was given 2 g of IV cefazolin.  SCDs were placed.  She was then placed under general anesthesia without complication.  She was rolled into a slight left lateral position and appropriately padded.  Her left flank was then prepped and draped in the standard sterile surgical fashion.  A surgical timeout was performed.  She had a lot of extra skin that was overlying this. I made an elliptical incision and excised this skin as well as the very large lipoma. This is what it looked like clinically. This was excised in its entirety and passed off the table as a specimen. I then obtained hemostasis. I then closed this in multiple layers with 3-0 Vicryl to close the deep space. I closed the dermis with 3-0 Vicryl. I then closed the skin with 4-0 Monocryl. I did infiltrate 20 cc of Marcaine lidocaine mixture. I then placed Dermabond and Steri-Strips. A dressing was placed over this. She tolerated this well was extubated and transferred to recovery stable.

## 2012-07-10 ENCOUNTER — Encounter (HOSPITAL_COMMUNITY): Payer: Self-pay | Admitting: General Surgery

## 2012-07-14 ENCOUNTER — Encounter (INDEPENDENT_AMBULATORY_CARE_PROVIDER_SITE_OTHER): Payer: 59 | Admitting: General Surgery

## 2012-08-11 ENCOUNTER — Encounter (INDEPENDENT_AMBULATORY_CARE_PROVIDER_SITE_OTHER): Payer: Self-pay | Admitting: General Surgery

## 2012-08-11 ENCOUNTER — Ambulatory Visit (INDEPENDENT_AMBULATORY_CARE_PROVIDER_SITE_OTHER): Payer: 59 | Admitting: General Surgery

## 2012-08-11 VITALS — BP 132/74 | HR 78 | Temp 97.6°F | Resp 18 | Ht 62.0 in | Wt 221.6 lb

## 2012-08-11 DIAGNOSIS — Z09 Encounter for follow-up examination after completed treatment for conditions other than malignant neoplasm: Secondary | ICD-10-CM

## 2012-08-11 NOTE — Progress Notes (Signed)
Subjective:     Patient ID: Molly Johnson, female   DOB: 1955/07/11, 57 y.o.   MRN: [id number]  HPI  This is a 57 year old female underwent excision of a left flank mass. Her pathology is consistent with a lipoma. She returned today doing well. There is a mild amount of swelling at this area and it is ordered. She otherwise is returned all her normal activities. Review of Systems     Objective:   Physical Exam Left flank and hip incision healing well. There is a mild amount of edema around this. No infection    Assessment:     Status post excision of lipoma     Plan:     She is doing well. I told her she can begin to massage and this area with lotion. If there is still some swelling or any issues in the next 6-8 weeks asked her to call me back. I expect this will all be healed by then. She can return to full activity.

## 2012-11-12 ENCOUNTER — Other Ambulatory Visit: Payer: Self-pay

## 2012-11-12 DIAGNOSIS — Z1231 Encounter for screening mammogram for malignant neoplasm of breast: Secondary | ICD-10-CM

## 2012-12-08 ENCOUNTER — Ambulatory Visit
Admission: RE | Admit: 2012-12-08 | Discharge: 2012-12-08 | Disposition: A | Discharge status: Home / Self Care | Payer: 59 | Source: Ambulatory Visit

## 2012-12-08 DIAGNOSIS — Z1231 Encounter for screening mammogram for malignant neoplasm of breast: Secondary | ICD-10-CM

## 2013-11-23 ENCOUNTER — Other Ambulatory Visit: Payer: Self-pay

## 2013-11-23 DIAGNOSIS — Z1231 Encounter for screening mammogram for malignant neoplasm of breast: Secondary | ICD-10-CM

## 2013-12-10 ENCOUNTER — Ambulatory Visit
Admission: RE | Admit: 2013-12-10 | Discharge: 2013-12-10 | Disposition: A | Discharge status: Home / Self Care | Payer: 59 | Source: Ambulatory Visit

## 2013-12-10 DIAGNOSIS — Z1231 Encounter for screening mammogram for malignant neoplasm of breast: Secondary | ICD-10-CM

## 2014-11-19 ENCOUNTER — Other Ambulatory Visit: Payer: Self-pay

## 2014-11-19 DIAGNOSIS — Z1231 Encounter for screening mammogram for malignant neoplasm of breast: Secondary | ICD-10-CM

## 2014-12-15 ENCOUNTER — Ambulatory Visit
Admission: RE | Admit: 2014-12-15 | Discharge: 2014-12-15 | Disposition: A | Discharge status: Home / Self Care | Payer: Commercial Managed Care - HMO | Source: Ambulatory Visit

## 2014-12-15 DIAGNOSIS — Z1231 Encounter for screening mammogram for malignant neoplasm of breast: Secondary | ICD-10-CM

## 2015-01-25 ENCOUNTER — Emergency Department (HOSPITAL_COMMUNITY)
Admission: EM | Admit: 2015-01-25 | Discharge: 2015-01-25 | Disposition: A | Discharge status: Home / Self Care | Payer: Commercial Managed Care - HMO | Source: Home / Self Care | Attending: Emergency Medicine | Admitting: Emergency Medicine

## 2015-01-25 ENCOUNTER — Encounter (HOSPITAL_COMMUNITY): Payer: Self-pay | Admitting: Emergency Medicine

## 2015-01-25 DIAGNOSIS — M778 Other enthesopathies, not elsewhere classified: Secondary | ICD-10-CM

## 2015-01-25 DIAGNOSIS — M7581 Other shoulder lesions, right shoulder: Secondary | ICD-10-CM

## 2015-01-25 DIAGNOSIS — S46811A Strain of other muscles, fascia and tendons at shoulder and upper arm level, right arm, initial encounter: Secondary | ICD-10-CM

## 2015-01-25 MED ORDER — DICLOFENAC SODIUM 1 % TD GEL: 1.0000 "application " | Freq: Four times a day (QID) | TRANSDERMAL | Status: DC

## 2015-01-25 NOTE — Discharge Instructions (Signed)
Muscle Strain Diclofenac gel 4 times a day. Use heat as directed Limit use of the arm but do not keep him immobilized. A muscle strain is an injury that occurs when a muscle is stretched beyond its normal length. Usually a small number of muscle fibers are torn when this happens. Muscle strain is rated in degrees. First-degree strains have the least amount of muscle fiber tearing and pain. Second-degree and third-degree strains have increasingly more tearing and pain.  Usually, recovery from muscle strain takes 1-2 weeks. Complete healing takes 5-6 weeks.  CAUSES  Muscle strain happens when a sudden, violent force placed on a muscle stretches it too far. This may occur with lifting, sports, or a fall.  RISK FACTORS Muscle strain is especially common in athletes.  SIGNS AND SYMPTOMS At the site of the muscle strain, there may be:  Pain.  Bruising.  Swelling.  Difficulty using the muscle due to pain or lack of normal function. DIAGNOSIS  Your health care provider will perform a physical exam and ask about your medical history. TREATMENT  Often, the best treatment for a muscle strain is resting, icing, and applying cold compresses to the injured area.  HOME CARE INSTRUCTIONS : Since it has been 3 days since the injury it is time to start using heat. It no more the use of ice as instructed below.   Use the PRICE method of treatment to promote muscle healing during the first 2-3 days after your injury. The PRICE method involves:  Protecting the muscle from being injured again.  Restricting your activity and resting the injured body part.  Icing your injury. To do this, put ice in a plastic bag. Place a towel between your skin and the bag. Then, apply the ice and leave it on from 15-20 minutes each hour. After the third day, switch to moist heat packs.  Apply compression to the injured area with a splint or elastic bandage. Be careful not to wrap it too tightly. This may interfere with  blood circulation or increase swelling.  Elevate the injured body part above the level of your heart as often as you can.  Only take over-the-counter or prescription medicines for pain, discomfort, or fever as directed by your health care provider.  Warming up prior to exercise helps to prevent future muscle strains. SEEK MEDICAL CARE IF:   You have increasing pain or swelling in the injured area.  You have numbness, tingling, or a significant loss of strength in the injured area. MAKE SURE YOU:   Understand these instructions.  Will watch your condition.  Will get help right away if you are not doing well or get worse.   This information is not intended to replace advice given to you by your health care provider. Make sure you discuss any questions you have with your health care provider.   Document Released: 02/19/2005 Document Revised: 12/10/2012 Document Reviewed: 09/18/2012 Elsevier Interactive Patient Education [employer].

## 2015-01-25 NOTE — ED Provider Notes (Signed)
CSN: [id number]     Arrival date & time 01/25/15  1306 History   First MD Initiated Contact with Patient 01/25/15 1405     No chief complaint on file.  (Consider location/radiation/quality/duration/timing/severity/associated sxs/prior Treatment) HPI Comments: 59 year old female complaining of pain to the right upper outer arm for 3 days. She states 2-3 hours after trying to hold down and calm down her 58 year old autistic child she developed soreness over the right deltoid muscle. Over the past couple days the pain has increased and now with rest, especially after sleeping at night she has increased pain to the right deltoid muscle. She complains of limited range of motion of the arm. It is worse with abduction. She denies actual falling to the arm and denies blunt trauma to the arm or shoulder. Patient states that ibuprofen helps with the pain.   Past Medical History  Diagnosis Date  . Hypertension   . Obesity   . Headache(784.0)     h/o migraines   Past Surgical History  Procedure Laterality Date  . Cesarean section    . Colonoscopy      x2  . Mass excision Left 07/09/2012    Procedure: EXCISION MASS left flank;  Surgeon: Rolm Bookbinder, MD;  Location: [facility];  Service: General;  Laterality: Left;   Family History  Problem Relation Age of Onset  . Diabetes Mother    Social History  Substance Use Topics  . Smoking status: Former Smoker -- 0.25 packs/day for 5 years    Types: Cigarettes    Quit date: 12/16/1988  . Smokeless tobacco: Never Used  . Alcohol Use: No   OB History    No data available     Review of Systems  Constitutional: Positive for activity change. Negative for fever and chills.  HENT: Negative.   Respiratory: Negative.   Musculoskeletal: Positive for myalgias. Negative for back pain, joint swelling, neck pain and neck stiffness.       As per HPI  Skin: Negative for color change, pallor and rash.  Neurological: Negative.     Allergies   Aspirin  Home Medications   Prior to Admission medications   Medication Sig Start Date End Date Taking? Authorizing Provider  diclofenac sodium (VOLTAREN) 1 % GEL Apply 1 application topically 4 (four) times daily. 01/25/15   Janne Napoleon, NP  metoprolol succinate (TOPROL-XL) 50 MG 24 hr tablet Take 50 mg by mouth at bedtime. Take with or immediately following a meal.    Historical Provider, MD  triamterene-hydrochlorothiazide (DYAZIDE) 37.5-25 MG per capsule Take 1 capsule by mouth every morning.    Historical Provider, MD   Meds Ordered and Administered this Visit  Medications - No data to display  BP 148/85 mmHg  Pulse 77  Temp(Src) 98 F (36.7 C) (Oral)  Resp 16  SpO2 95% No data found.   Physical Exam  Constitutional: She is oriented to person, place, and time. She appears well-developed and well-nourished. No distress.  Eyes: EOM are normal.  Neck: Normal range of motion. Neck supple.  Cardiovascular: Normal rate.   Pulmonary/Chest: Effort normal. No respiratory distress.  Musculoskeletal: She exhibits no edema.  There is tenderness over the proximal deltoid tendon at the site of the glenoid as well as tenderness over the deltoid muscle. Abduction is limited to 30 secondary to pain of the deltoid muscle. There is no tenderness to the joint line. No tenderness to the trapezius. No swelling, deformity or asymmetry. Distal neurovascular motor sensory is grossly intact.  Neurological: She is alert and oriented to person, place, and time. She exhibits normal muscle tone.  Skin: Skin is warm and dry.  Psychiatric: She has a normal mood and affect.  Nursing note and vitals reviewed.   ED Course  Procedures (including critical care time)  Labs Review Labs Reviewed - No data to display  Imaging Review No results found.   Visual Acuity Review  Right Eye Distance:   Left Eye Distance:   Bilateral Distance:    Right Eye Near:   Left Eye Near:    Bilateral Near:          MDM   1. Deltoid tendinitis of right shoulder   2. Strain of deltoid muscle, right, initial encounter    Diclofenac gel 4 times a day. Use heat as directed Limit use of the arm but do not keep him immobilized.     Janne Napoleon, NP 01/25/15 1425

## 2015-01-25 NOTE — ED Notes (Signed)
C/o right shoulder pain onset Saturday of last week Denies inj/trauma but recalls trying to hold down her 58 y/o son w/autism who was having a tantrum Pain increases w/activity A&O x4... No acute distress.

## 2015-11-16 ENCOUNTER — Other Ambulatory Visit: Payer: Self-pay | Admitting: Obstetrics & Gynecology

## 2015-11-16 DIAGNOSIS — Z1231 Encounter for screening mammogram for malignant neoplasm of breast: Secondary | ICD-10-CM

## 2015-12-16 ENCOUNTER — Ambulatory Visit
Admission: RE | Admit: 2015-12-16 | Discharge: 2015-12-16 | Disposition: A | Discharge status: Home / Self Care | Payer: Commercial Managed Care - HMO | Source: Ambulatory Visit | Attending: Obstetrics & Gynecology | Admitting: Obstetrics & Gynecology

## 2015-12-16 DIAGNOSIS — Z1231 Encounter for screening mammogram for malignant neoplasm of breast: Secondary | ICD-10-CM

## 2016-03-28 DIAGNOSIS — R05 Cough: Secondary | ICD-10-CM | POA: Diagnosis not present

## 2016-03-28 DIAGNOSIS — Z20828 Contact with and (suspected) exposure to other viral communicable diseases: Secondary | ICD-10-CM | POA: Diagnosis not present

## 2016-03-28 DIAGNOSIS — R0989 Other specified symptoms and signs involving the circulatory and respiratory systems: Secondary | ICD-10-CM | POA: Diagnosis not present

## 2016-06-12 DIAGNOSIS — J309 Allergic rhinitis, unspecified: Secondary | ICD-10-CM | POA: Diagnosis not present

## 2016-06-12 DIAGNOSIS — I10 Essential (primary) hypertension: Secondary | ICD-10-CM | POA: Diagnosis not present

## 2016-11-26 ENCOUNTER — Other Ambulatory Visit: Payer: Self-pay | Admitting: Obstetrics & Gynecology

## 2016-11-26 DIAGNOSIS — Z1239 Encounter for other screening for malignant neoplasm of breast: Secondary | ICD-10-CM

## 2016-12-14 DIAGNOSIS — Z0000 Encounter for general adult medical examination without abnormal findings: Secondary | ICD-10-CM | POA: Diagnosis not present

## 2016-12-14 DIAGNOSIS — Z136 Encounter for screening for cardiovascular disorders: Secondary | ICD-10-CM | POA: Diagnosis not present

## 2016-12-18 ENCOUNTER — Ambulatory Visit
Admission: RE | Admit: 2016-12-18 | Discharge: 2016-12-18 | Disposition: A | Discharge status: Home / Self Care | Payer: 59 | Source: Ambulatory Visit | Attending: Obstetrics & Gynecology | Admitting: Obstetrics & Gynecology

## 2016-12-18 DIAGNOSIS — Z1239 Encounter for other screening for malignant neoplasm of breast: Secondary | ICD-10-CM

## 2016-12-18 DIAGNOSIS — Z1231 Encounter for screening mammogram for malignant neoplasm of breast: Secondary | ICD-10-CM | POA: Diagnosis not present

## 2017-01-03 ENCOUNTER — Encounter: Payer: Self-pay | Admitting: Obstetrics & Gynecology

## 2017-01-03 ENCOUNTER — Ambulatory Visit (INDEPENDENT_AMBULATORY_CARE_PROVIDER_SITE_OTHER): Payer: 59 | Admitting: Obstetrics & Gynecology

## 2017-01-03 VITALS — BP 136/80 | Ht 61.0 in | Wt 233.0 lb

## 2017-01-03 DIAGNOSIS — Z780 Asymptomatic menopausal state: Secondary | ICD-10-CM

## 2017-01-03 DIAGNOSIS — Z6841 Body Mass Index (BMI) 40.0 and over, adult: Secondary | ICD-10-CM

## 2017-01-03 DIAGNOSIS — Z01419 Encounter for gynecological examination (general) (routine) without abnormal findings: Secondary | ICD-10-CM

## 2017-01-03 NOTE — Progress Notes (Signed)
Molly Johnson December 09, 1955 [id number]   History:    61 y.o. G3P1A2 Married.  Son with Autism 17 yo, graduating from [employer] this year.  Will work x 1 year at [employer]  RP:  Established patient presenting for annual gyn exam   HPI:  Menopause, no HRT.  No PMB.  No pelvic pain.  Normal vaginal secretions.  Not sexually active.  Breasts wnl.  Mictions/BMs wnl.  BMI 44.02.  Ready to start a diet and fitness plan to loose weight.  Past medical history,surgical history, family history and social history were all reviewed and documented in the EPIC chart.  Gynecologic History No LMP recorded. Patient is postmenopausal. Contraception: post menopausal status Last Pap: 11/2015. Results were: Negative Last mammogram: 12/2016. Results were: Negative [place] 10/2012 Bone Density:  Never  Obstetric History OB History  Gravida Para Term Preterm AB Living  3 1     2 1   SAB TAB Ectopic Multiple Live Births  2            # Outcome Date GA Lbr Len/2nd Weight Sex Delivery Anes PTL Lv  3 SAB           2 SAB           1 Para                ROS: A ROS was performed and pertinent positives and negatives are included in the history.  GENERAL: No fevers or chills. HEENT: No change in vision, no earache, sore throat or sinus congestion. NECK: No pain or stiffness. CARDIOVASCULAR: No chest pain or pressure. No palpitations. PULMONARY: No shortness of breath, cough or wheeze. GASTROINTESTINAL: No abdominal pain, nausea, vomiting or diarrhea, melena or bright red blood per rectum. GENITOURINARY: No urinary frequency, urgency, hesitancy or dysuria. MUSCULOSKELETAL: No joint or muscle pain, no back pain, no recent trauma. DERMATOLOGIC: No rash, no itching, no lesions. ENDOCRINE: No polyuria, polydipsia, no heat or cold intolerance. No recent change in weight. HEMATOLOGICAL: No anemia or easy bruising or bleeding. NEUROLOGIC: No headache, seizures, numbness, tingling or weakness. PSYCHIATRIC: No depression, no loss of  interest in normal activity or change in sleep pattern.     Exam:   BP 136/80   Ht 5\' 1"  (1.549 m)   Wt 233 lb (105.7 kg)   BMI 44.02 kg/m   Body mass index is 44.02 kg/m.  General appearance : Well developed well nourished female. No acute distress HEENT: Eyes: no retinal hemorrhage or exudates,  Neck supple, trachea midline, no carotid bruits, no thyroidmegaly Lungs: Clear to auscultation, no rhonchi or wheezes, or rib retractions  Heart: Regular rate and rhythm, no murmurs or gallops Breast:Examined in sitting and supine position were symmetrical in appearance, no palpable masses or tenderness,  no skin retraction, no nipple inversion, no nipple discharge, no skin discoloration, no axillary or supraclavicular lymphadenopathy Abdomen: no palpable masses or tenderness, no rebound or guarding Extremities: no edema or skin discoloration or tenderness  Pelvic: Vulva normal  Bartholin, Urethra, Skene Glands: Within normal limits             Vagina: No gross lesions or discharge  Cervix: No gross lesions or discharge  Uterus  AV, normal size, shape and consistency, non-tender and mobile  Adnexa  Without masses or tenderness  Anus and perineum  normal    Assessment/Plan:  61 y.o. female for annual exam   1. Well female exam with routine gynecological exam Normal gyn exam.  Pap neg 11/2015, will repeat next year.  Breasts wnl.  Screening Mammo neg 12/2016.  Labs Fam MD.  2. Menopause present No HRT.  No PMB.  Well tolerated.  Vit D supplements/Ca++ in food/Weight bearing physical activity.  F/U Bone Density. - DG Bone Density; Future  3. Class 3 severe obesity due to excess calories without serious comorbidity with body mass index (BMI) of 40.0 to 44.9 in adult [facility]) Low Calorie/Carb diet discussed in details.  Increase water intake.  [employer] recommended.  Regular physical activity walking 5 x per week x at least 30 minutes and weight lifting recommended.  Will start on  her own and f/u for a weight management visit in 3 months as needed.  Counseling on above issues >50% x 10 minutes.    Princess Bruins MD, 10:53 AM 01/03/2017

## 2017-01-04 NOTE — Patient Instructions (Signed)
1. Well female exam with routine gynecological exam Normal gyn exam.  Pap neg 11/2015, will repeat next year.  Breasts wnl.  Screening Mammo neg 12/2016.  Labs Fam MD.  2. Menopause present No HRT.  No PMB.  Well tolerated.  Vit D supplements/Ca++ in food/Weight bearing physical activity.  F/U Bone Density. - DG Bone Density; Future  3. Class 3 severe obesity due to excess calories without serious comorbidity with body mass index (BMI) of 40.0 to 44.9 in adult [facility]) Low Calorie/Carb diet discussed in details.  Increase water intake.  [employer] recommended.  Regular physical activity walking 5 x per week x at least 30 minutes and weight lifting recommended.  Will start on her own and f/u for a weight management visit in 3 months as needed.  Molly Johnson, it was a pleasure seeing you today!  I will see you again in 3 months to see your progress with better nutrition, fitness and weight loss if you feel that it would help you.   Calorie Counting for Weight Loss Calories are units of energy. Your body needs a certain amount of calories from food to keep you going throughout the day. When you eat more calories than your body needs, your body stores the extra calories as fat. When you eat fewer calories than your body needs, your body burns fat to get the energy it needs. Calorie counting means keeping track of how many calories you eat and drink each day. Calorie counting can be helpful if you need to lose weight. If you make sure to eat fewer calories than your body needs, you should lose weight. Ask your health care provider what a healthy weight is for you. For calorie counting to work, you will need to eat the right number of calories in a day in order to lose a healthy amount of weight per week. A dietitian can help you determine how many calories you need in a day and will give you suggestions on how to reach your calorie goal.  A healthy amount of weight to lose per week is usually 1-2 lb (0.5-0.9  kg). This usually means that your daily calorie intake should be reduced by 500-750 calories.  Eating 1,200 - 1,500 calories per day can help most women lose weight.  Eating 1,500 - 1,800 calories per day can help most men lose weight.  What is my plan? My goal is to have __________ calories per day. If I have this many calories per day, I should lose around __________ pounds per week. What do I need to know about calorie counting? In order to meet your daily calorie goal, you will need to:  Find out how many calories are in each food you would like to eat. Try to do this before you eat.  Decide how much of the food you plan to eat.  Write down what you ate and how many calories it had. Doing this is called keeping a food log.  To successfully lose weight, it is important to balance calorie counting with a healthy lifestyle that includes regular activity. Aim for 150 minutes of moderate exercise (such as walking) or 75 minutes of vigorous exercise (such as running) each week. Where do I find calorie information?  The number of calories in a food can be found on a Nutrition Facts label. If a food does not have a Nutrition Facts label, try to look up the calories online or ask your dietitian for help. Remember that calories  are listed per serving. If you choose to have more than one serving of a food, you will have to multiply the calories per serving by the amount of servings you plan to eat. For example, the label on a package of bread might say that a serving size is 1 slice and that there are 90 calories in a serving. If you eat 1 slice, you will have eaten 90 calories. If you eat 2 slices, you will have eaten 180 calories. How do I keep a food log? Immediately after each meal, record the following information in your food log:  What you ate. Don't forget to include toppings, sauces, and other extras on the food.  How much you ate. This can be measured in cups, ounces, or number of  items.  How many calories each food and drink had.  The total number of calories in the meal.  Keep your food log near you, such as in a small notebook in your pocket, or use a mobile app or website. Some programs will calculate calories for you and show you how many calories you have left for the day to meet your goal. What are some calorie counting tips?  Use your calories on foods and drinks that will fill you up and not leave you hungry: ? Some examples of foods that fill you up are nuts and nut butters, vegetables, lean proteins, and high-fiber foods like whole grains. High-fiber foods are foods with more than 5 g fiber per serving. ? Drinks such as sodas, specialty coffee drinks, alcohol, and juices have a lot of calories, yet do not fill you up.  Eat nutritious foods and avoid empty calories. Empty calories are calories you get from foods or beverages that do not have many vitamins or protein, such as candy, sweets, and soda. It is better to have a nutritious high-calorie food (such as an avocado) than a food with few nutrients (such as a bag of chips).  Know how many calories are in the foods you eat most often. This will help you calculate calorie counts faster.  Pay attention to calories in drinks. Low-calorie drinks include water and unsweetened drinks.  Pay attention to nutrition labels for "low fat" or "fat free" foods. These foods sometimes have the same amount of calories or more calories than the full fat versions. They also often have added sugar, starch, or salt, to make up for flavor that was removed with the fat.  Find a way of tracking calories that works for you. Get creative. Try different apps or programs if writing down calories does not work for you. What are some portion control tips?  Know how many calories are in a serving. This will help you know how many servings of a certain food you can have.  Use a measuring cup to measure serving sizes. You could also try  weighing out portions on a kitchen scale. With time, you will be able to estimate serving sizes for some foods.  Take some time to put servings of different foods on your favorite plates, bowls, and cups so you know what a serving looks like.  Try not to eat straight from a bag or box. Doing this can lead to overeating. Put the amount you would like to eat in a cup or on a plate to make sure you are eating the right portion.  Use smaller plates, glasses, and bowls to prevent overeating.  Try not to multitask (for example, watch TV or use  your computer) while eating. If it is time to eat, sit down at a table and enjoy your food. This will help you to know when you are full. It will also help you to be aware of what you are eating and how much you are eating. What are tips for following this plan? Reading food labels  Check the calorie count compared to the serving size. The serving size may be smaller than what you are used to eating.  Check the source of the calories. Make sure the food you are eating is high in vitamins and protein and low in saturated and trans fats. Shopping  Read nutrition labels while you shop. This will help you make healthy decisions before you decide to purchase your food.  Make a grocery list and stick to it. Cooking  Try to cook your favorite foods in a healthier way. For example, try baking instead of frying.  Use low-fat dairy products. Meal planning  Use more fruits and vegetables. Half of your plate should be fruits and vegetables.  Include lean proteins like poultry and fish. How do I count calories when eating out?  Ask for smaller portion sizes.  Consider sharing an entree and sides instead of getting your own entree.  If you get your own entree, eat only half. Ask for a box at the beginning of your meal and put the rest of your entree in it so you are not tempted to eat it.  If calories are listed on the menu, choose the lower calorie  options.  Choose dishes that include vegetables, fruits, whole grains, low-fat dairy products, and lean protein.  Choose items that are boiled, broiled, grilled, or steamed. Stay away from items that are buttered, battered, fried, or served with cream sauce. Items labeled "crispy" are usually fried, unless stated otherwise.  Choose water, low-fat milk, unsweetened iced tea, or other drinks without added sugar. If you want an alcoholic beverage, choose a lower calorie option such as a glass of wine or light beer.  Ask for dressings, sauces, and syrups on the side. These are usually high in calories, so you should limit the amount you eat.  If you want a salad, choose a garden salad and ask for grilled meats. Avoid extra toppings like bacon, cheese, or fried items. Ask for the dressing on the side, or ask for olive oil and vinegar or lemon to use as dressing.  Estimate how many servings of a food you are given. For example, a serving of cooked rice is  cup or about the size of half a baseball. Knowing serving sizes will help you be aware of how much food you are eating at restaurants. The list below tells you how big or small some common portion sizes are based on everyday objects: ? 1 oz-4 stacked dice. ? 3 oz-1 deck of cards. ? 1 tsp-1 die. ? 1 Tbsp- a ping-pong ball. ? 2 Tbsp-1 ping-pong ball. ?  cup- baseball. ? 1 cup-1 baseball. Summary  Calorie counting means keeping track of how many calories you eat and drink each day. If you eat fewer calories than your body needs, you should lose weight.  A healthy amount of weight to lose per week is usually 1-2 lb (0.5-0.9 kg). This usually means reducing your daily calorie intake by 500-750 calories.  The number of calories in a food can be found on a Nutrition Facts label. If a food does not have a Nutrition Facts label, try to look  up the calories online or ask your dietitian for help.  Use your calories on foods and drinks that will fill  you up, and not on foods and drinks that will leave you hungry.  Use smaller plates, glasses, and bowls to prevent overeating. This information is not intended to replace advice given to you by your health care provider. Make sure you discuss any questions you have with your health care provider. Document Released: 02/19/2005 Document Revised: 01/20/2016 Document Reviewed: 01/20/2016 Elsevier Interactive Patient Education  2017 [employer].  Exercising to [employer] Exercising can help you to lose weight. In order to lose weight through exercise, you need to do vigorous-intensity exercise. You can tell that you are exercising with vigorous intensity if you are breathing very hard and fast and cannot hold a conversation while exercising. Moderate-intensity exercise helps to maintain your current weight. You can tell that you are exercising at a moderate level if you have a higher heart rate and faster breathing, but you are still able to hold a conversation. How often should I exercise? Choose an activity that you enjoy and set realistic goals. Your health care provider can help you to make an activity plan that works for you. Exercise regularly as directed by your health care provider. This may include:  Doing resistance training twice each week, such as: ? Push-ups. ? Sit-ups. ? Lifting weights. ? Using resistance bands.  Doing a given intensity of exercise for a given amount of time. Choose from these options: ? 150 minutes of moderate-intensity exercise every week. ? 75 minutes of vigorous-intensity exercise every week. ? A mix of moderate-intensity and vigorous-intensity exercise every week.  Children, pregnant women, people who are out of shape, people who are overweight, and older adults may need to consult a health care provider for individual recommendations. If you have any sort of medical condition, be sure to consult your health care provider before starting a new exercise  program. What are some activities that can help me to lose weight?  Walking at a rate of at least 4.5 miles an hour.  Jogging or running at a rate of 5 miles per hour.  Biking at a rate of at least 10 miles per hour.  Lap swimming.  Roller-skating or in-line skating.  Cross-country skiing.  Vigorous competitive sports, such as football, basketball, and soccer.  Jumping rope.  Aerobic dancing. How can I be more active in my day-to-day activities?  Use the stairs instead of the elevator.  Take a walk during your lunch break.  If you drive, park your car farther away from work or school.  If you take public transportation, get off one stop early and walk the rest of the way.  Make all of your phone calls while standing up and walking around.  Get up, stretch, and walk around every 30 minutes throughout the day. What guidelines should I follow while exercising?  Do not exercise so much that you hurt yourself, feel dizzy, or get very short of breath.  Consult your health care provider prior to starting a new exercise program.  Wear comfortable clothes and shoes with good support.  Drink plenty of water while you exercise to prevent dehydration or heat stroke. Body water is lost during exercise and must be replaced.  Work out until you breathe faster and your heart beats faster. This information is not intended to replace advice given to you by your health care provider. Make sure you discuss any questions you have  with your health care provider. Document Released: 03/24/2010 Document Revised: 07/28/2015 Document Reviewed: 07/23/2013 Elsevier Interactive Patient Education  [employer].

## 2017-01-08 ENCOUNTER — Other Ambulatory Visit: Payer: Self-pay | Admitting: Gynecology

## 2017-01-08 DIAGNOSIS — Z13820 Encounter for screening for osteoporosis: Secondary | ICD-10-CM

## 2017-02-05 ENCOUNTER — Other Ambulatory Visit: Payer: Self-pay | Admitting: Internal Medicine

## 2017-02-05 ENCOUNTER — Ambulatory Visit
Admission: RE | Admit: 2017-02-05 | Discharge: 2017-02-05 | Disposition: A | Discharge status: Home / Self Care | Payer: 59 | Source: Ambulatory Visit | Attending: Internal Medicine | Admitting: Internal Medicine

## 2017-02-05 DIAGNOSIS — M25512 Pain in left shoulder: Secondary | ICD-10-CM

## 2017-02-05 DIAGNOSIS — M25519 Pain in unspecified shoulder: Secondary | ICD-10-CM | POA: Diagnosis not present

## 2017-02-05 DIAGNOSIS — M19012 Primary osteoarthritis, left shoulder: Secondary | ICD-10-CM | POA: Diagnosis not present

## 2017-02-05 DIAGNOSIS — I10 Essential (primary) hypertension: Secondary | ICD-10-CM | POA: Diagnosis not present

## 2017-02-08 DIAGNOSIS — M7532 Calcific tendinitis of left shoulder: Secondary | ICD-10-CM | POA: Diagnosis not present

## 2017-11-12 ENCOUNTER — Other Ambulatory Visit: Payer: Self-pay | Admitting: Obstetrics & Gynecology

## 2017-11-12 DIAGNOSIS — Z1231 Encounter for screening mammogram for malignant neoplasm of breast: Secondary | ICD-10-CM

## 2017-12-20 ENCOUNTER — Ambulatory Visit
Admission: RE | Admit: 2017-12-20 | Discharge: 2017-12-20 | Disposition: A | Discharge status: Home / Self Care | Payer: 59 | Source: Ambulatory Visit | Attending: Obstetrics & Gynecology | Admitting: Obstetrics & Gynecology

## 2017-12-20 DIAGNOSIS — Z1231 Encounter for screening mammogram for malignant neoplasm of breast: Secondary | ICD-10-CM | POA: Diagnosis not present

## 2017-12-31 DIAGNOSIS — Z0000 Encounter for general adult medical examination without abnormal findings: Secondary | ICD-10-CM | POA: Diagnosis not present

## 2017-12-31 DIAGNOSIS — Z13220 Encounter for screening for lipoid disorders: Secondary | ICD-10-CM | POA: Diagnosis not present

## 2018-01-09 ENCOUNTER — Encounter: Payer: 59 | Admitting: Obstetrics & Gynecology

## 2018-04-16 ENCOUNTER — Ambulatory Visit: Payer: 59 | Admitting: Obstetrics & Gynecology

## 2018-04-16 ENCOUNTER — Encounter: Payer: Self-pay | Admitting: Obstetrics & Gynecology

## 2018-04-16 VITALS — BP 140/82 | Ht 61.0 in | Wt 231.0 lb

## 2018-04-16 DIAGNOSIS — Z1151 Encounter for screening for human papillomavirus (HPV): Secondary | ICD-10-CM

## 2018-04-16 DIAGNOSIS — Z13820 Encounter for screening for osteoporosis: Secondary | ICD-10-CM | POA: Diagnosis not present

## 2018-04-16 DIAGNOSIS — Z6841 Body Mass Index (BMI) 40.0 and over, adult: Secondary | ICD-10-CM

## 2018-04-16 DIAGNOSIS — Z780 Asymptomatic menopausal state: Secondary | ICD-10-CM

## 2018-04-16 DIAGNOSIS — Z01419 Encounter for gynecological examination (general) (routine) without abnormal findings: Secondary | ICD-10-CM

## 2018-04-16 NOTE — Progress Notes (Signed)
Molly Johnson February 18, 1956 [id number]   History:    63 y.o. G3P1A2L1 Married.  Son 31 yo with Autism, now working and driving.  RP:  Established patient presenting for annual gyn exam   HPI: Menopause, well on no HRT.  No PMB.  No pelvic pain.  Abstinent.  Urine and bowel movements normal.  Breast normal.  Body mass index 43.65.  Lost 2 pounds since last year.  Health labs with family physician.  Needs to schedule colonoscopy, last one in 2014 on a 5-year schedule.  Past medical history,surgical history, family history and social history were all reviewed and documented in the EPIC chart.  Gynecologic History No LMP recorded. Patient is postmenopausal. Contraception: abstinence and post menopausal status Last Pap: 11/2015. Results were: Negative Last mammogram: 12/2017. Results were: Negative Bone Density: Schedule here now. Colonoscopy: 2014.  5 yr schedule, will schedule now.  Obstetric History OB History  Gravida Para Term Preterm AB Living  3 1     2 1   SAB TAB Ectopic Multiple Live Births  2            # Outcome Date GA Lbr Len/2nd Weight Sex Delivery Anes PTL Lv  3 SAB           2 SAB           1 Para              ROS: A ROS was performed and pertinent positives and negatives are included in the history.  GENERAL: No fevers or chills. HEENT: No change in vision, no earache, sore throat or sinus congestion. NECK: No pain or stiffness. CARDIOVASCULAR: No chest pain or pressure. No palpitations. PULMONARY: No shortness of breath, cough or wheeze. GASTROINTESTINAL: No abdominal pain, nausea, vomiting or diarrhea, melena or bright red blood per rectum. GENITOURINARY: No urinary frequency, urgency, hesitancy or dysuria. MUSCULOSKELETAL: No joint or muscle pain, no back pain, no recent trauma. DERMATOLOGIC: No rash, no itching, no lesions. ENDOCRINE: No polyuria, polydipsia, no heat or cold intolerance. No recent change in weight. HEMATOLOGICAL: No anemia or easy bruising or bleeding.  NEUROLOGIC: No headache, seizures, numbness, tingling or weakness. PSYCHIATRIC: No depression, no loss of interest in normal activity or change in sleep pattern.     Exam:   BP 140/82   Ht 5\' 1"  (1.549 m)   Wt 231 lb (104.8 kg)   BMI 43.65 kg/m   Body mass index is 43.65 kg/m.  General appearance : Well developed well nourished female. No acute distress HEENT: Eyes: no retinal hemorrhage or exudates,  Neck supple, trachea midline, no carotid bruits, no thyroidmegaly Lungs: Clear to auscultation, no rhonchi or wheezes, or rib retractions  Heart: Regular rate and rhythm, no murmurs or gallops Breast:Examined in sitting and supine position were symmetrical in appearance, no palpable masses or tenderness,  no skin retraction, no nipple inversion, no nipple discharge, no skin discoloration, no axillary or supraclavicular lymphadenopathy Abdomen: no palpable masses or tenderness, no rebound or guarding Extremities: no edema or skin discoloration or tenderness  Pelvic: Vulva: Normal             Vagina: No gross lesions or discharge  Cervix: No gross lesions or discharge.  Pap/HPV HR done.  Uterus AV, normal size, shape and consistency, non-tender and mobile  Adnexa  Without masses or tenderness   Anus: Normal   Assessment/Plan:  63 y.o. female for annual exam   1. Encounter for routine gynecological examination with  Papanicolaou smear of cervix Normal gynecologic exam in menopause.  Pap with high-risk HPV done today.  Breast exam normal.  Screening mammogram in October 2019 was negative.  Patient will schedule a screening colonoscopy now.  Health labs with family physician.  2. Postmenopausal Menopause, well on no hormone replacement therapy.  No postmenopausal bleeding.  3. Screening for osteoporosis Schedule bone density here now.  Vitamin D supplements, calcium intake of 1.5 g/day including nutritional and supplemental, regular weightbearing physical activity recommended. - DG  Bone Density; Future  4. Class 3 severe obesity due to excess calories without serious comorbidity with body mass index (BMI) of 40.0 to 44.9 in adult [facility]) Recommend lower calorie/carb diet such as [employer].  Aerobic physical activities 5 times a week and weightlifting every 2 days.  Princess Bruins MD, 11:16 AM 04/16/2018

## 2018-04-16 NOTE — Patient Instructions (Signed)
1. Encounter for routine gynecological examination with Papanicolaou smear of cervix Normal gynecologic exam in menopause.  Pap with high-risk HPV done today.  Breast exam normal.  Screening mammogram in October 2019 was negative.  Patient will schedule a screening colonoscopy now.  Health labs with family physician.  2. Postmenopausal Menopause, well on no hormone replacement therapy.  No postmenopausal bleeding.  3. Screening for osteoporosis Schedule bone density here now.  Vitamin D supplements, calcium intake of 1.5 g/day including nutritional and supplemental, regular weightbearing physical activity recommended. - DG Bone Density; Future  4. Class 3 severe obesity due to excess calories without serious comorbidity with body mass index (BMI) of 40.0 to 44.9 in adult [facility]) Recommend lower calorie/carb diet such as [employer].  Aerobic physical activities 5 times a week and weightlifting every 2 days.  Molly Johnson, it was a pleasure seeing you today!  I will inform you of your results as soon as they are available.

## 2018-04-18 LAB — PAP, TP IMAGING W/ HPV RNA, RFLX HPV TYPE 16,18/45: HPV DNA HIGH RISK: NOT DETECTED

## 2018-07-03 DIAGNOSIS — D696 Thrombocytopenia, unspecified: Secondary | ICD-10-CM | POA: Diagnosis not present

## 2018-07-03 DIAGNOSIS — J309 Allergic rhinitis, unspecified: Secondary | ICD-10-CM | POA: Diagnosis not present

## 2018-07-03 DIAGNOSIS — I10 Essential (primary) hypertension: Secondary | ICD-10-CM | POA: Diagnosis not present

## 2018-12-02 ENCOUNTER — Encounter: Payer: Self-pay | Admitting: Gynecology

## 2018-12-08 ENCOUNTER — Other Ambulatory Visit: Payer: Self-pay | Admitting: Obstetrics & Gynecology

## 2018-12-08 DIAGNOSIS — Z1231 Encounter for screening mammogram for malignant neoplasm of breast: Secondary | ICD-10-CM

## 2019-01-23 ENCOUNTER — Other Ambulatory Visit: Payer: Self-pay

## 2019-01-23 ENCOUNTER — Ambulatory Visit
Admission: RE | Admit: 2019-01-23 | Discharge: 2019-01-23 | Disposition: A | Discharge status: Home / Self Care | Payer: 59 | Source: Ambulatory Visit | Attending: Obstetrics & Gynecology | Admitting: Obstetrics & Gynecology

## 2019-01-23 DIAGNOSIS — Z1231 Encounter for screening mammogram for malignant neoplasm of breast: Secondary | ICD-10-CM

## 2019-05-07 ENCOUNTER — Encounter: Payer: 59 | Admitting: Obstetrics & Gynecology

## 2019-05-18 ENCOUNTER — Other Ambulatory Visit: Payer: Self-pay

## 2019-05-19 ENCOUNTER — Ambulatory Visit (INDEPENDENT_AMBULATORY_CARE_PROVIDER_SITE_OTHER): Payer: 59 | Admitting: Obstetrics & Gynecology

## 2019-05-19 ENCOUNTER — Encounter: Payer: Self-pay | Admitting: Obstetrics & Gynecology

## 2019-05-19 VITALS — BP 134/80 | Ht 61.5 in | Wt 237.0 lb

## 2019-05-19 DIAGNOSIS — Z6841 Body Mass Index (BMI) 40.0 and over, adult: Secondary | ICD-10-CM | POA: Diagnosis not present

## 2019-05-19 DIAGNOSIS — Z780 Asymptomatic menopausal state: Secondary | ICD-10-CM | POA: Diagnosis not present

## 2019-05-19 DIAGNOSIS — Z01419 Encounter for gynecological examination (general) (routine) without abnormal findings: Secondary | ICD-10-CM

## 2019-05-19 NOTE — Progress Notes (Signed)
Molly Johnson 07-20-1955 [id number]   History:    64 y.o. G3P1A2L1 Married.  Son 41 yo with Autism, now working and driving.  RP:  Established patient presenting for annual gyn exam   HPI: Menopause, well on no HRT.  No PMB.  No pelvic pain.  Abstinent.  Urine and bowel movements normal.  Breast normal.  Body mass index 44.06. Health labs with family physician.  Needs to schedule colonoscopy, last one in 2014 on a 5-year schedule.  Past medical history,surgical history, family history and social history were all reviewed and documented in the EPIC chart.  Past medical history,surgical history, family history and social history were all reviewed and documented in the EPIC chart.  Gynecologic History No LMP recorded. Patient is postmenopausal.  Obstetric History OB History  Gravida Para Term Preterm AB Living  3 1     2 1   SAB TAB Ectopic Multiple Live Births  2            # Outcome Date GA Lbr Len/2nd Weight Sex Delivery Anes PTL Lv  3 SAB           2 SAB           1 Para              ROS: A ROS was performed and pertinent positives and negatives are included in the history.  GENERAL: No fevers or chills. HEENT: No change in vision, no earache, sore throat or sinus congestion. NECK: No pain or stiffness. CARDIOVASCULAR: No chest pain or pressure. No palpitations. PULMONARY: No shortness of breath, cough or wheeze. GASTROINTESTINAL: No abdominal pain, nausea, vomiting or diarrhea, melena or bright red blood per rectum. GENITOURINARY: No urinary frequency, urgency, hesitancy or dysuria. MUSCULOSKELETAL: No joint or muscle pain, no back pain, no recent trauma. DERMATOLOGIC: No rash, no itching, no lesions. ENDOCRINE: No polyuria, polydipsia, no heat or cold intolerance. No recent change in weight. HEMATOLOGICAL: No anemia or easy bruising or bleeding. NEUROLOGIC: No headache, seizures, numbness, tingling or weakness. PSYCHIATRIC: No depression, no loss of interest in normal activity or  change in sleep pattern.     Exam:   BP 134/80   Ht 5' 1.5" (1.562 m)   Wt 237 lb (107.5 kg)   BMI 44.06 kg/m   Body mass index is 44.06 kg/m.  General appearance : Well developed well nourished female. No acute distress HEENT: Eyes: no retinal hemorrhage or exudates,  Neck supple, trachea midline, no carotid bruits, no thyroidmegaly Lungs: Clear to auscultation, no rhonchi or wheezes, or rib retractions  Heart: Regular rate and rhythm, no murmurs or gallops Breast:Examined in sitting and supine position were symmetrical in appearance, no palpable masses or tenderness,  no skin retraction, no nipple inversion, no nipple discharge, no skin discoloration, no axillary or supraclavicular lymphadenopathy Abdomen: no palpable masses or tenderness, no rebound or guarding Extremities: no edema or skin discoloration or tenderness  Pelvic: Vulva: Normal             Vagina: No gross lesions or discharge  Cervix: No gross lesions or discharge  Uterus  AV, normal size, shape and consistency, non-tender and mobile  Adnexa  Without masses or tenderness  Anus: Normal   Assessment/Plan:  64 y.o. female for annual exam   1. Well female exam with routine gynecological exam Normal gynecologic exam.  Pap test February 2020 was negative with negative high-risk HPV, no indication to repeat this year.  Breast normal.  Screening mammogram November 2020 was negative.  Will schedule a colonoscopy this year.  Health labs with family physician.  2. Postmenopausal Well on no hormone replacement therapy.  No postmenopausal bleeding.  Will organize a bone density at age 67.  Recommend vitamin D supplements, calcium intake of 1200 mg daily and regular weightbearing physical activities.  3. Class 3 severe obesity due to excess calories without serious comorbidity with body mass index (BMI) of 40.0 to 44.9 in adult [facility]) Recommend a low calorie/carb diet such as [employer].  Aerobic physical activities 5  times a week and light weightlifting every 2 days.  Princess Bruins MD, 10:53 AM 05/19/2019

## 2019-05-19 NOTE — Patient Instructions (Signed)
1. Well female exam with routine gynecological exam Normal gynecologic exam.  Pap test February 2020 was negative with negative high-risk HPV, no indication to repeat this year.  Breast normal.  Screening mammogram November 2020 was negative.  Will schedule a colonoscopy this year.  Health labs with family physician.  2. Postmenopausal Well on no hormone replacement therapy.  No postmenopausal bleeding.  Will organize a bone density at age 64.  Recommend vitamin D supplements, calcium intake of 1200 mg daily and regular weightbearing physical activities.  3. Class 3 severe obesity due to excess calories without serious comorbidity with body mass index (BMI) of 40.0 to 44.9 in adult [facility]) Recommend a low calorie/carb diet such as [employer].  Aerobic physical activities 5 times a week and light weightlifting every 2 days.  Molly Johnson, it was a pleasure seeing you today!

## 2020-01-07 ENCOUNTER — Other Ambulatory Visit: Payer: Self-pay | Admitting: Obstetrics & Gynecology

## 2020-01-07 DIAGNOSIS — Z1231 Encounter for screening mammogram for malignant neoplasm of breast: Secondary | ICD-10-CM

## 2020-02-17 ENCOUNTER — Ambulatory Visit
Admission: RE | Admit: 2020-02-17 | Discharge: 2020-02-17 | Disposition: A | Discharge status: Home / Self Care | Payer: 59 | Source: Ambulatory Visit | Attending: Obstetrics & Gynecology | Admitting: Obstetrics & Gynecology

## 2020-02-17 ENCOUNTER — Other Ambulatory Visit: Payer: Self-pay

## 2020-02-17 DIAGNOSIS — Z1231 Encounter for screening mammogram for malignant neoplasm of breast: Secondary | ICD-10-CM

## 2020-05-19 ENCOUNTER — Encounter: Payer: Self-pay | Admitting: Obstetrics & Gynecology

## 2020-05-19 ENCOUNTER — Other Ambulatory Visit: Payer: Self-pay

## 2020-05-19 ENCOUNTER — Ambulatory Visit (INDEPENDENT_AMBULATORY_CARE_PROVIDER_SITE_OTHER): Payer: 59 | Admitting: Obstetrics & Gynecology

## 2020-05-19 VITALS — BP 138/80 | Ht 61.5 in | Wt 232.0 lb

## 2020-05-19 DIAGNOSIS — Z01419 Encounter for gynecological examination (general) (routine) without abnormal findings: Secondary | ICD-10-CM

## 2020-05-19 DIAGNOSIS — Z6841 Body Mass Index (BMI) 40.0 and over, adult: Secondary | ICD-10-CM

## 2020-05-19 DIAGNOSIS — Z780 Asymptomatic menopausal state: Secondary | ICD-10-CM

## 2020-05-19 NOTE — Progress Notes (Signed)
Molly Johnson 1955-10-07 [id number]   History:    65 y.o. G3P1A2L1 Married. Son 42 yo with Autism, now working and driving.  KG:[id number] presenting for annual gyn exam   OHY:[id number], well on no HRT. No PMB. No pelvic pain.Abstinent. Urine and bowel movements normal. Breast normal. Screening mammo neg 02/2020.  Body mass index 43.13.Walking.  Improving diet.  Health labs with family physician. Needs to schedule colonoscopy, last one in 2014 on a 5-year schedule.  Past medical history,surgical history, family history and social history were all reviewed and documented in the EPIC chart.  Gynecologic History No LMP recorded. Patient is postmenopausal.  Obstetric History OB History  Gravida Para Term Preterm AB Living  3 1     2 1   SAB IAB Ectopic Multiple Live Births  2            # Outcome Date GA Lbr Len/2nd Weight Sex Delivery Anes PTL Lv  3 SAB           2 SAB           1 Para              ROS: A ROS was performed and pertinent positives and negatives are included in the history.  GENERAL: No fevers or chills. HEENT: No change in vision, no earache, sore throat or sinus congestion. NECK: No pain or stiffness. CARDIOVASCULAR: No chest pain or pressure. No palpitations. PULMONARY: No shortness of breath, cough or wheeze. GASTROINTESTINAL: No abdominal pain, nausea, vomiting or diarrhea, melena or bright red blood per rectum. GENITOURINARY: No urinary frequency, urgency, hesitancy or dysuria. MUSCULOSKELETAL: No joint or muscle pain, no back pain, no recent trauma. DERMATOLOGIC: No rash, no itching, no lesions. ENDOCRINE: No polyuria, polydipsia, no heat or cold intolerance. No recent change in weight. HEMATOLOGICAL: No anemia or easy bruising or bleeding. NEUROLOGIC: No headache, seizures, numbness, tingling or weakness. PSYCHIATRIC: No depression, no loss of interest in normal activity or change in sleep pattern.     Exam:   BP 138/80   Ht 5' 1.5"  (1.562 m)   Wt 232 lb (105.2 kg)   BMI 43.13 kg/m   Body mass index is 43.13 kg/m.  General appearance : Well developed well nourished female. No acute distress HEENT: Eyes: no retinal hemorrhage or exudates,  Neck supple, trachea midline, no carotid bruits, no thyroidmegaly Lungs: Clear to auscultation, no rhonchi or wheezes, or rib retractions  Heart: Regular rate and rhythm, no murmurs or gallops Breast:Examined in sitting and supine position were symmetrical in appearance, no palpable masses or tenderness,  no skin retraction, no nipple inversion, no nipple discharge, no skin discoloration, no axillary or supraclavicular lymphadenopathy Abdomen: no palpable masses or tenderness, no rebound or guarding Extremities: no edema or skin discoloration or tenderness  Pelvic: Vulva: Normal             Vagina: No gross lesions or discharge  Cervix: No gross lesions or discharge.  Pap reflex done.  Uterus AV, normal size, shape and consistency, non-tender and mobile  Adnexa  Without masses or tenderness  Anus: Normal   Assessment/Plan:  65 y.o. female for annual exam   1. Encounter for routine gynecological examination with Papanicolaou smear of cervix Gynecologic exam normal in menopause.  Pap reflex done.  Breasts normal.  Screening mammo Neg 04/2019, needs to schedule.  Colono to repeat now.  Health labs with Fam MD.  2. Postmenopausal Well on no HRT.  No PMB.  Will start BD at 50. Vit D, Ca++ 1.5 g/d total, regular weight bearing physical activities.  3. Class 3 severe obesity due to excess calories without serious comorbidity with body mass index (BMI) of 40.0 to 44.9 in adult (HCC) Decrease Calories/Carbs, intermittent fasting.  Aerobic activities 5 times per week, light weight lifting every 2 days.  Other orders - losartan (COZAAR) 100 MG tablet; Take 100 mg by mouth daily. - atorvastatin (LIPITOR) 10 MG tablet; Take 10 mg by mouth daily.  Princess Bruins MD, 11:02 AM  05/19/2020

## 2020-05-20 ENCOUNTER — Telehealth: Payer: Self-pay | Admitting: *Deleted

## 2020-05-20 LAB — PAP IG W/ RFLX HPV ASCU

## 2020-05-20 NOTE — Telephone Encounter (Signed)
Pt aware of recommendations

## 2020-05-20 NOTE — Telephone Encounter (Signed)
Patients my chart message below:  Eliseo Squires to Princess Bruins, MD      05/20/20 1:58 PM Dr. Dellis Filbert my right side started hurting yesterday after visit,  mostly hurt when getting up and down never had this problem before. Notice floor was wet after check up didn't understand that either I used the bathroom before check-up. what should I do?

## 2020-05-20 NOTE — Telephone Encounter (Signed)
Spoke with patient regarding her my chart message. She states its more discomfort than pain on right side. Severity 10. Discomfort is more noticeable when she's getting up and down. Will route to DR. Lavoie for recommendations.

## 2020-05-20 NOTE — Telephone Encounter (Signed)
Sounds like a wall muscular pain.  Recommend rest and observation time.  May take Ibuprofen as needed.  The wet floor is simply because I pass the speculum under warm water to make the exam more comfortable :-)))

## 2020-05-28 ENCOUNTER — Encounter: Payer: Self-pay | Admitting: Obstetrics & Gynecology

## 2021-01-16 ENCOUNTER — Other Ambulatory Visit: Payer: Self-pay | Admitting: Obstetrics & Gynecology

## 2021-01-16 DIAGNOSIS — Z1231 Encounter for screening mammogram for malignant neoplasm of breast: Secondary | ICD-10-CM

## 2021-02-22 ENCOUNTER — Ambulatory Visit
Admission: RE | Admit: 2021-02-22 | Discharge: 2021-02-22 | Disposition: A | Discharge status: Home / Self Care | Payer: Self-pay | Source: Ambulatory Visit | Attending: Obstetrics & Gynecology | Admitting: Obstetrics & Gynecology

## 2021-02-22 DIAGNOSIS — Z1231 Encounter for screening mammogram for malignant neoplasm of breast: Secondary | ICD-10-CM

## 2021-05-25 ENCOUNTER — Other Ambulatory Visit: Payer: Self-pay

## 2021-05-25 ENCOUNTER — Encounter: Payer: Self-pay | Admitting: Obstetrics & Gynecology

## 2021-05-25 ENCOUNTER — Ambulatory Visit (INDEPENDENT_AMBULATORY_CARE_PROVIDER_SITE_OTHER): Payer: 59 | Admitting: Obstetrics & Gynecology

## 2021-05-25 VITALS — BP 124/74 | HR 70 | Resp 16 | Ht 61.75 in | Wt 233.0 lb

## 2021-05-25 DIAGNOSIS — Z87440 Personal history of urinary (tract) infections: Secondary | ICD-10-CM | POA: Diagnosis not present

## 2021-05-25 DIAGNOSIS — R8290 Unspecified abnormal findings in urine: Secondary | ICD-10-CM

## 2021-05-25 DIAGNOSIS — Z780 Asymptomatic menopausal state: Secondary | ICD-10-CM

## 2021-05-25 DIAGNOSIS — Z6841 Body Mass Index (BMI) 40.0 and over, adult: Secondary | ICD-10-CM

## 2021-05-25 DIAGNOSIS — Z01419 Encounter for gynecological examination (general) (routine) without abnormal findings: Secondary | ICD-10-CM

## 2021-05-25 MED ORDER — NITROFURANTOIN MONOHYD MACRO 100 MG PO CAPS: 100.0000 mg | ORAL_CAPSULE | Freq: Two times a day (BID) | ORAL | 0 refills | Status: AC

## 2021-05-25 NOTE — Progress Notes (Addendum)
? ? ?Molly Johnson [birth year] [id number] ? ? ?History:    66 y.o.  G3P1A2L1 Married.  Son 58 yo with Autism, now working, driving and has a girlfriend. ?  ?RP:  Established patient presenting for annual gyn exam  ?  ?HPI: Postmenopause, well on no HRT.  No PMB.  No pelvic pain.  Abstinent. Pap 05/2020 Negative.  Urine and bowel movements normal.  H/O Cystitis in 03/2021, was asymptomatic, would like to do a U/A today. Breast normal.  Screening mammo neg 02/2021.  Body mass index 42.96. Walking.  Improving diet.  Health labs with family physician. Colono 07/2020. ?  ? ?Past medical history,surgical history, family history and social history were all reviewed and documented in the EPIC chart. ? ?Gynecologic History ?No LMP recorded. Patient is postmenopausal. ? ?Obstetric History ?OB History  ?Gravida Para Term Preterm AB Living  ?'3 1     2 1  '$ ?SAB IAB Ectopic Multiple Live Births  ?2          ?  ?# Outcome Date GA Lbr Len/2nd Weight Sex Delivery Anes PTL Lv  ?3 SAB           ?2 SAB           ?1 Para           ? ? ? ?ROS: A ROS was performed and pertinent positives and negatives are included in the history. ? GENERAL: No fevers or chills. HEENT: No change in vision, no earache, sore throat or sinus congestion. NECK: No pain or stiffness. CARDIOVASCULAR: No chest pain or pressure. No palpitations. PULMONARY: No shortness of breath, cough or wheeze. GASTROINTESTINAL: No abdominal pain, nausea, vomiting or diarrhea, melena or bright red blood per rectum. GENITOURINARY: No urinary frequency, urgency, hesitancy or dysuria. MUSCULOSKELETAL: No joint or muscle pain, no back pain, no recent trauma. DERMATOLOGIC: No rash, no itching, no lesions. ENDOCRINE: No polyuria, polydipsia, no heat or cold intolerance. No recent change in weight. HEMATOLOGICAL: No anemia or easy bruising or bleeding. NEUROLOGIC: No headache, seizures, numbness, tingling or weakness. PSYCHIATRIC: No depression, no loss of interest in normal activity or change  in sleep pattern.  ?  ? ?Exam: ? ? ?BP 124/74   Pulse 70   Resp 16   Ht 5' 1.75" (1.568 m)   Wt 233 lb (105.7 kg)   BMI 42.96 kg/m?  ? ?Body mass index is 42.96 kg/m?. ? ?General appearance : Well developed well nourished female. No acute distress ?HEENT: Eyes: no retinal hemorrhage or exudates,  Neck supple, trachea midline, no carotid bruits, no thyroidmegaly ?Lungs: Clear to auscultation, no rhonchi or wheezes, or rib retractions  ?Heart: Regular rate and rhythm, no murmurs or gallops ?Breast:Examined in sitting and supine position were symmetrical in appearance, no palpable masses or tenderness,  no skin retraction, no nipple inversion, no nipple discharge, no skin discoloration, no axillary or supraclavicular lymphadenopathy ?Abdomen: no palpable masses or tenderness, no rebound or guarding ?Extremities: no edema or skin discoloration or tenderness ? ?Pelvic: Vulva: Normal ?            Vagina: No gross lesions or discharge ? Cervix: No gross lesions or discharge ? Uterus  AV, normal size, shape and consistency, non-tender and mobile ? Adnexa  Without masses or tenderness ? Anus: Normal ? ?U/A: Yellow cloudy, protein 1+, nitrites negative, white blood cells 6-10, red blood cells 10-20, bacteria moderate.  Urine culture pending. ? ? ?Assessment/Plan:  66 y.o. female for annual exam  ? ?  1. Well female exam with routine gynecological exam ?Postmenopause, well on no HRT.  No PMB.  No pelvic pain.  Abstinent. Pap 05/2020 Negative.  Urine and bowel movements normal.  Breast normal.  Screening mammo neg 02/2021.  Body mass index 42.96. Walking.  Improving diet.  Health labs with family physician. Colono 07/2020. ?  ?2. Postmenopausal ?Postmenopause, well on no HRT.  No PMB.  No pelvic pain.  Abstinent. ? ?3. History of cystitis ?H/O Cystitis in 03/2021, was asymptomatic, would like to do a U/A today. U/A abnormal.  Will treat with MacroBID.  Prescription sent to pharmacy.  U. Culture pending. ?- Urinalysis,Complete  w/RFL Culture ? ?4. Class 3 severe obesity due to excess calories without serious comorbidity with body mass index (BMI) of 40.0 to 44.9 in adult [facility]) ?Body mass index 42.96. Walking.  Improving diet. ? ?Other orders ?- losartan-hydrochlorothiazide (HYZAAR) 100-25 MG tablet; Take 1 tablet by mouth daily.  ?- nitrofurantoin, macrocrystal-monohydrate, (MACROBID) 100 MG capsule; Take 1 capsule (100 mg total) by mouth 2 (two) times daily for 7 days.  ? ?Princess Bruins MD, 10:33 AM 05/25/2021 ? ? ?  ?

## 2021-05-25 NOTE — Addendum Note (Signed)
Addended by: Princess Bruins on: 05/25/2021 11:19 AM ? ? Modules accepted: Orders ? ?

## 2021-05-25 NOTE — Addendum Note (Signed)
Addended by: Susy Manor on: 05/25/2021 10:58 AM ? ? Modules accepted: Orders ? ?

## 2021-05-27 LAB — URINALYSIS, COMPLETE W/RFL CULTURE
Bilirubin Urine: NEGATIVE
Casts: NONE SEEN /LPF
Crystals: NONE SEEN /HPF
Glucose, UA: NEGATIVE
Hyaline Cast: NONE SEEN /LPF
Ketones, ur: NEGATIVE
Nitrites, Initial: NEGATIVE
Specific Gravity, Urine: 1.025 (ref 1.001–1.035)
Yeast: NONE SEEN /HPF
pH: 5.5 (ref 5.0–8.0)

## 2021-05-27 LAB — URINE CULTURE
MICRO NUMBER:: 13170477
SPECIMEN QUALITY:: ADEQUATE

## 2021-05-27 LAB — CULTURE INDICATED

## 2022-01-08 ENCOUNTER — Other Ambulatory Visit: Payer: Self-pay | Admitting: Internal Medicine

## 2022-01-08 DIAGNOSIS — Z1231 Encounter for screening mammogram for malignant neoplasm of breast: Secondary | ICD-10-CM

## 2022-02-13 DIAGNOSIS — R7303 Prediabetes: Secondary | ICD-10-CM | POA: Diagnosis not present

## 2022-02-13 DIAGNOSIS — I700 Atherosclerosis of aorta: Secondary | ICD-10-CM | POA: Diagnosis not present

## 2022-02-13 DIAGNOSIS — N1831 Chronic kidney disease, stage 3a: Secondary | ICD-10-CM | POA: Diagnosis not present

## 2022-02-13 DIAGNOSIS — Z0000 Encounter for general adult medical examination without abnormal findings: Secondary | ICD-10-CM | POA: Diagnosis not present

## 2022-02-13 DIAGNOSIS — Z23 Encounter for immunization: Secondary | ICD-10-CM | POA: Diagnosis not present

## 2022-02-13 DIAGNOSIS — K8020 Calculus of gallbladder without cholecystitis without obstruction: Secondary | ICD-10-CM | POA: Diagnosis not present

## 2022-02-13 DIAGNOSIS — E559 Vitamin D deficiency, unspecified: Secondary | ICD-10-CM | POA: Diagnosis not present

## 2022-02-13 DIAGNOSIS — I10 Essential (primary) hypertension: Secondary | ICD-10-CM | POA: Diagnosis not present

## 2022-02-13 DIAGNOSIS — E785 Hyperlipidemia, unspecified: Secondary | ICD-10-CM | POA: Diagnosis not present

## 2022-02-13 DIAGNOSIS — G4733 Obstructive sleep apnea (adult) (pediatric): Secondary | ICD-10-CM | POA: Diagnosis not present

## 2022-02-13 DIAGNOSIS — D696 Thrombocytopenia, unspecified: Secondary | ICD-10-CM | POA: Diagnosis not present

## 2022-02-14 DIAGNOSIS — N3021 Other chronic cystitis with hematuria: Secondary | ICD-10-CM | POA: Diagnosis not present

## 2022-02-14 DIAGNOSIS — R3121 Asymptomatic microscopic hematuria: Secondary | ICD-10-CM | POA: Diagnosis not present

## 2022-02-14 DIAGNOSIS — R8271 Bacteriuria: Secondary | ICD-10-CM | POA: Diagnosis not present

## 2022-02-15 ENCOUNTER — Other Ambulatory Visit: Payer: Self-pay | Admitting: Internal Medicine

## 2022-02-15 DIAGNOSIS — E2839 Other primary ovarian failure: Secondary | ICD-10-CM

## 2022-02-15 DIAGNOSIS — R8271 Bacteriuria: Secondary | ICD-10-CM | POA: Diagnosis not present

## 2022-02-21 IMAGING — MG MM DIGITAL SCREENING BILAT W/ TOMO AND CAD
6 of 12 series · 6 of 36 positions shown · non-contrast
Comparison: Previous exam(s).

CLINICAL DATA: Screening.

EXAM:
DIGITAL SCREENING BILATERAL MAMMOGRAM WITH TOMOSYNTHESIS AND CAD
TECHNIQUE: Bilateral screening digital craniocaudal and mediolateral oblique
mammograms were obtained. Bilateral screening digital breast
tomosynthesis was performed. The images were evaluated with
computer-aided detection.

[L MLO synth-2D (1 of 2)]
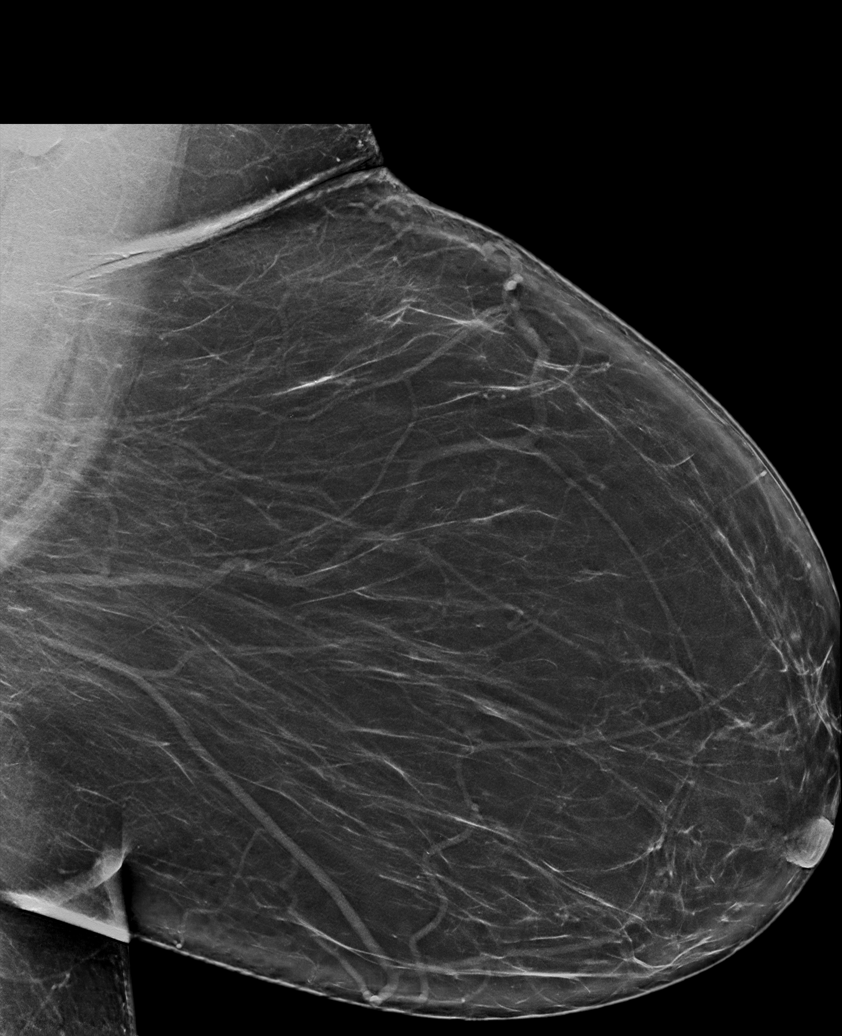

[R CC synth-2D]
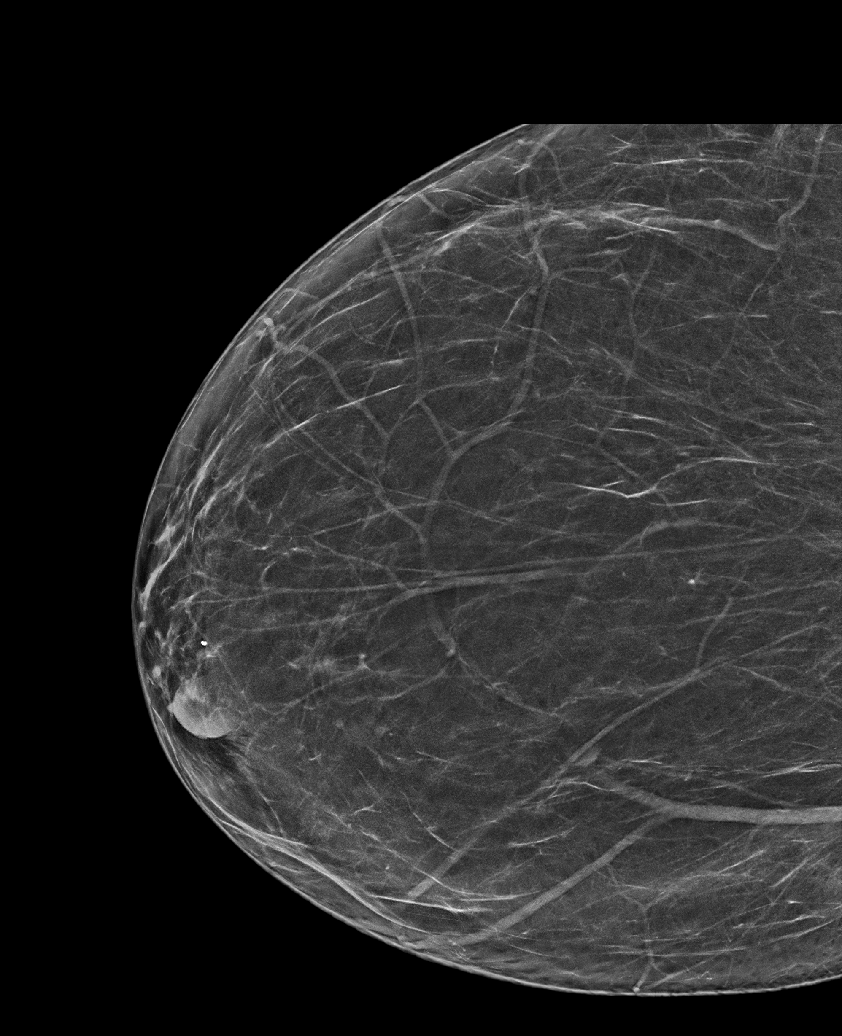

[L CC synth-2D]
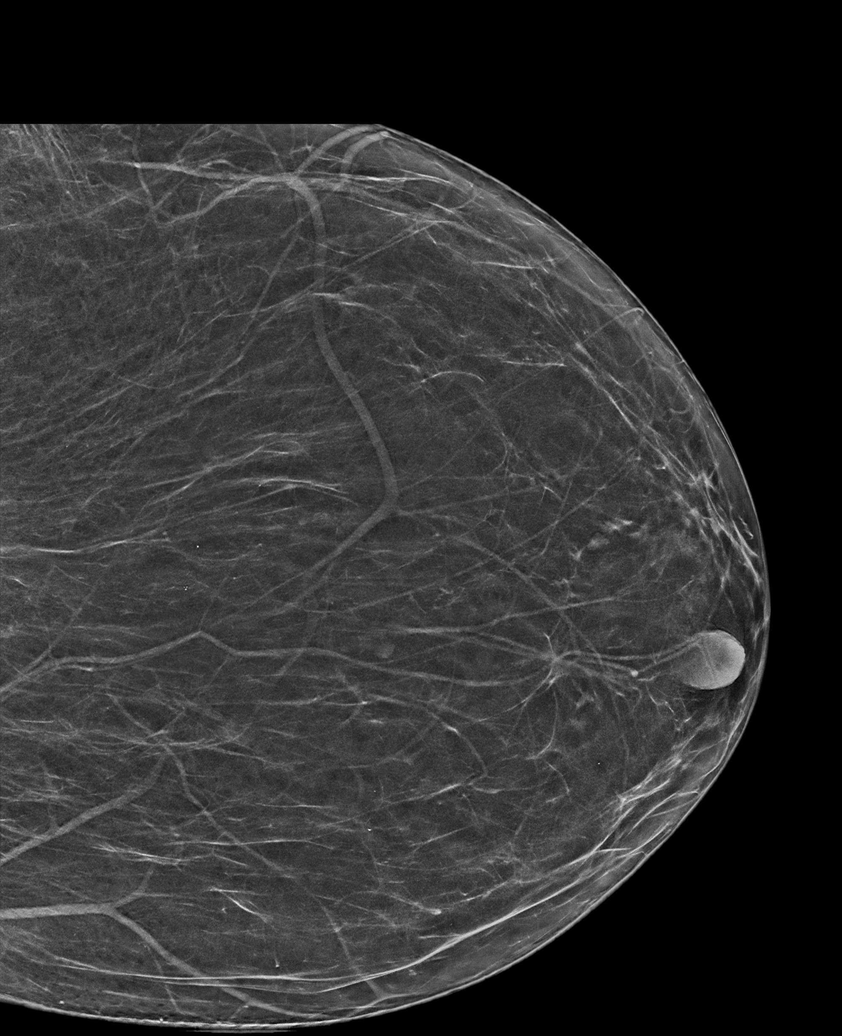

[L MLO synth-2D (2 of 2)]
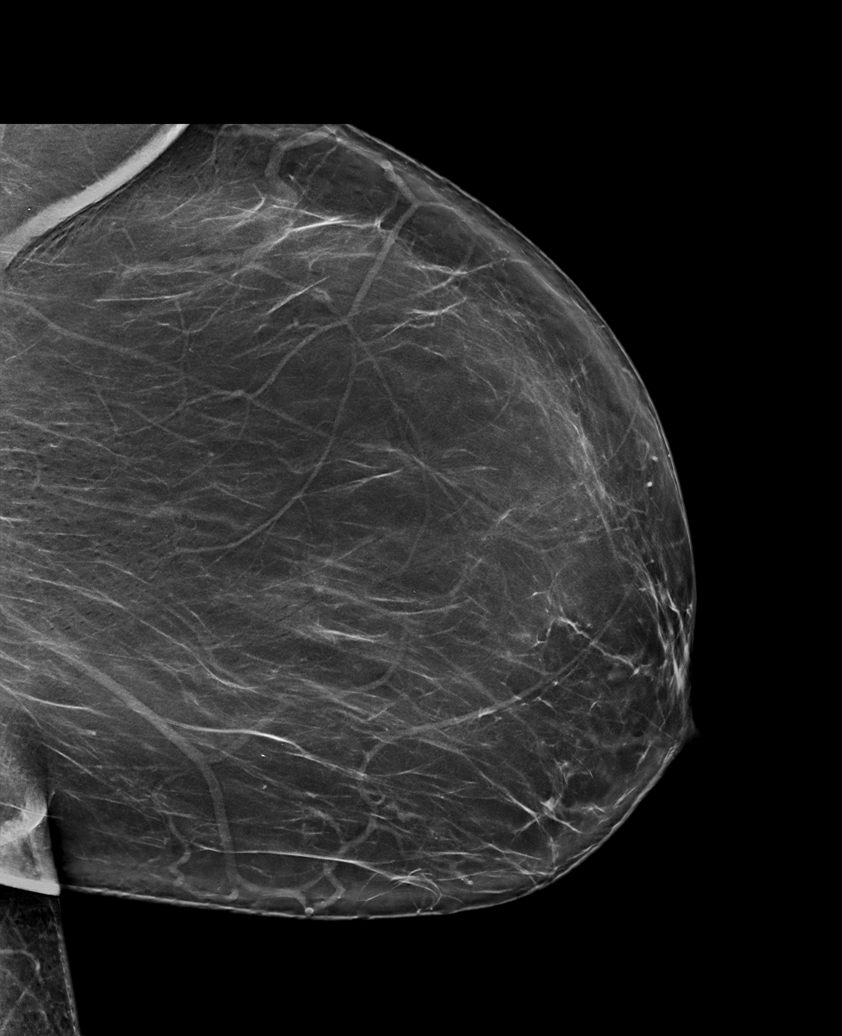

[R MLO synth-2D (1 of 2)]
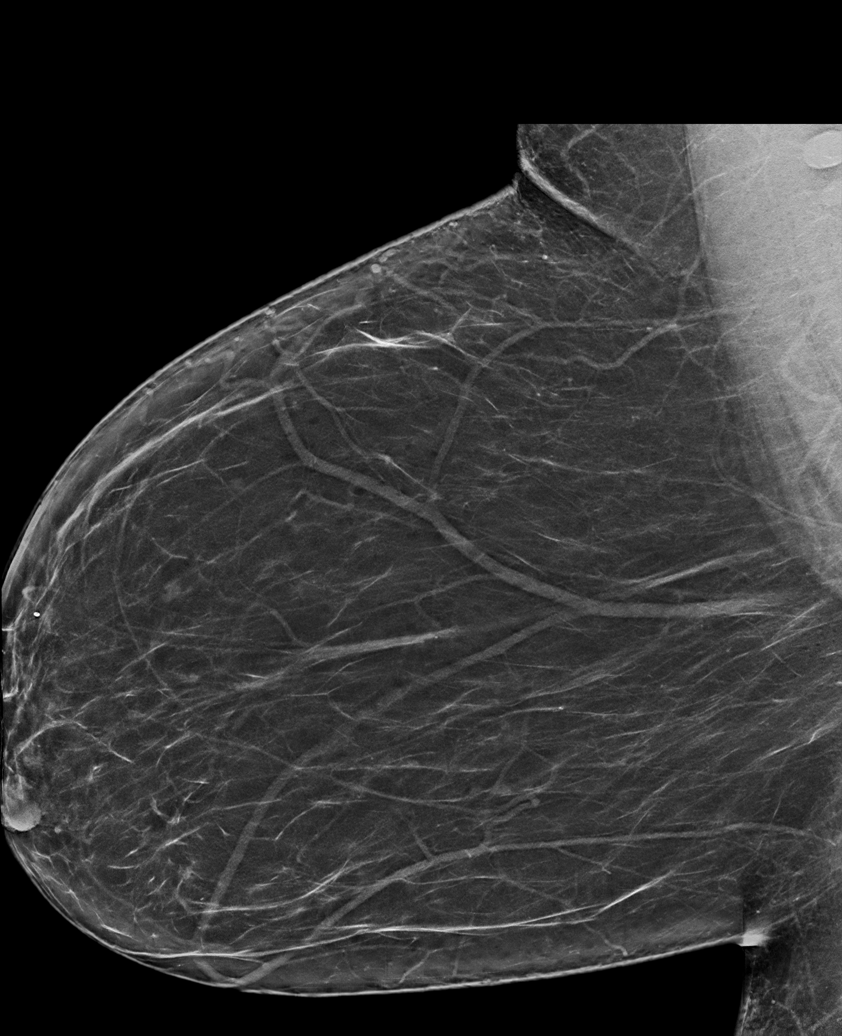

[R MLO synth-2D (2 of 2)]
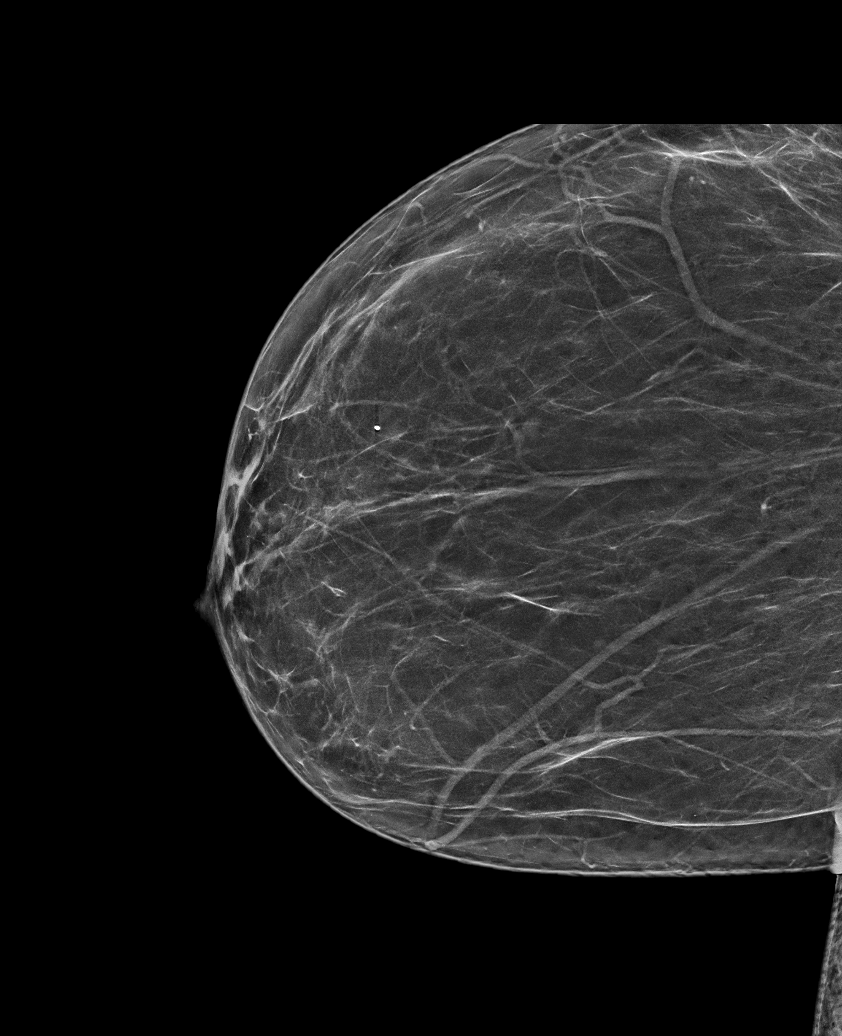

[6 of 36 positions shown; findings below may reference images not displayed]

ACR Breast Density Category b: There are scattered areas of
fibroglandular density.
FINDINGS: There are no findings suspicious for malignancy.
IMPRESSION: No mammographic evidence of malignancy. A result letter of this
screening mammogram will be mailed directly to the patient.

RECOMMENDATION:
Screening mammogram in one year. (Code:51-O-LD2)

BI-RADS CATEGORY  1: Negative.

## 2022-02-22 DIAGNOSIS — G4733 Obstructive sleep apnea (adult) (pediatric): Secondary | ICD-10-CM | POA: Diagnosis not present

## 2022-03-09 ENCOUNTER — Ambulatory Visit
Admission: RE | Admit: 2022-03-09 | Discharge: 2022-03-09 | Disposition: A | Discharge status: Home / Self Care | Payer: Medicare Other | Source: Ambulatory Visit | Attending: Internal Medicine | Admitting: Internal Medicine

## 2022-03-09 DIAGNOSIS — Z1231 Encounter for screening mammogram for malignant neoplasm of breast: Secondary | ICD-10-CM | POA: Diagnosis not present

## 2022-03-25 DIAGNOSIS — G4733 Obstructive sleep apnea (adult) (pediatric): Secondary | ICD-10-CM | POA: Diagnosis not present

## 2022-04-25 DIAGNOSIS — G4733 Obstructive sleep apnea (adult) (pediatric): Secondary | ICD-10-CM | POA: Diagnosis not present

## 2022-04-27 DIAGNOSIS — G4733 Obstructive sleep apnea (adult) (pediatric): Secondary | ICD-10-CM | POA: Diagnosis not present

## 2022-04-30 DIAGNOSIS — I10 Essential (primary) hypertension: Secondary | ICD-10-CM | POA: Diagnosis not present

## 2022-04-30 DIAGNOSIS — G4733 Obstructive sleep apnea (adult) (pediatric): Secondary | ICD-10-CM | POA: Diagnosis not present

## 2022-05-24 DIAGNOSIS — G4733 Obstructive sleep apnea (adult) (pediatric): Secondary | ICD-10-CM | POA: Diagnosis not present

## 2022-05-31 ENCOUNTER — Ambulatory Visit (INDEPENDENT_AMBULATORY_CARE_PROVIDER_SITE_OTHER): Payer: Medicare Other | Admitting: Obstetrics & Gynecology

## 2022-05-31 ENCOUNTER — Encounter: Payer: Self-pay | Admitting: Obstetrics & Gynecology

## 2022-05-31 ENCOUNTER — Other Ambulatory Visit (HOSPITAL_COMMUNITY)
Admission: RE | Admit: 2022-05-31 | Discharge: 2022-05-31 | Disposition: A | Discharge status: Home / Self Care | Payer: Medicare Other | Source: Ambulatory Visit | Attending: Obstetrics & Gynecology | Admitting: Obstetrics & Gynecology

## 2022-05-31 VITALS — BP 120/76 | Resp 16 | Ht 61.5 in | Wt 231.0 lb

## 2022-05-31 DIAGNOSIS — Z01419 Encounter for gynecological examination (general) (routine) without abnormal findings: Secondary | ICD-10-CM | POA: Insufficient documentation

## 2022-05-31 DIAGNOSIS — Z780 Asymptomatic menopausal state: Secondary | ICD-10-CM | POA: Diagnosis not present

## 2022-05-31 DIAGNOSIS — N390 Urinary tract infection, site not specified: Secondary | ICD-10-CM | POA: Diagnosis not present

## 2022-05-31 DIAGNOSIS — Z6841 Body Mass Index (BMI) 40.0 and over, adult: Secondary | ICD-10-CM | POA: Diagnosis not present

## 2022-05-31 NOTE — Progress Notes (Addendum)
Molly Johnson Mar 19, 1955 [id number]   History:    67 y.o.  G3P1A2L1 Married.  Son 71 yo with Autism, now working, driving.   RP:  Established patient presenting for annual gyn exam    HPI: Postmenopause, well on no HRT.  No PMB.  No pelvic pain.  Abstinent. Pap 05/2020 Negative. Pap reflex today.  Urine and bowel movements normal.  Recurrent Cystitis, usually asymptomatic.  Will do U/A today. Breasts normal.  Screening mammo neg 03/2022.  Body mass index 42.94. Walking.  Improving diet.  Health labs with family physician. Colono 07/2020.  BD scheduled in 08/2022.    Past medical history,surgical history, family history and social history were all reviewed and documented in the EPIC chart.  Gynecologic History No LMP recorded. Patient is postmenopausal.  Obstetric History OB History  Gravida Para Term Preterm AB Living  3 1 1   2 1   SAB IAB Ectopic Multiple Live Births  2            # Outcome Date GA Lbr Len/2nd Weight Sex Delivery Anes PTL Lv  3 SAB           2 SAB           1 Term              ROS: A ROS was performed and pertinent positives and negatives are included in the history. GENERAL: No fevers or chills. HEENT: No change in vision, no earache, sore throat or sinus congestion. NECK: No pain or stiffness. CARDIOVASCULAR: No chest pain or pressure. No palpitations. PULMONARY: No shortness of breath, cough or wheeze. GASTROINTESTINAL: No abdominal pain, nausea, vomiting or diarrhea, melena or bright red blood per rectum. GENITOURINARY: No urinary frequency, urgency, hesitancy or dysuria. MUSCULOSKELETAL: No joint or muscle pain, no back pain, no recent trauma. DERMATOLOGIC: No rash, no itching, no lesions. ENDOCRINE: No polyuria, polydipsia, no heat or cold intolerance. No recent change in weight. HEMATOLOGICAL: No anemia or easy bruising or bleeding. NEUROLOGIC: No headache, seizures, numbness, tingling or weakness. PSYCHIATRIC: No depression, no loss of interest in normal activity or  change in sleep pattern.     Exam:   BP 120/76   Resp 16   Ht 5' 1.5" (1.562 m)   Wt 231 lb (104.8 kg)   SpO2 99%   BMI 42.94 kg/m   Body mass index is 42.94 kg/m.  General appearance : Well developed well nourished female. No acute distress HEENT: Eyes: no retinal hemorrhage or exudates,  Neck supple, trachea midline, no carotid bruits, no thyroidmegaly Lungs: Clear to auscultation, no rhonchi or wheezes, or rib retractions  Heart: Regular rate and rhythm, no murmurs or gallops Breast:Examined in sitting and supine position were symmetrical in appearance, no palpable masses or tenderness,  no skin retraction, no nipple inversion, no nipple discharge, no skin discoloration, no axillary or supraclavicular lymphadenopathy Abdomen: no palpable masses or tenderness, no rebound or guarding Extremities: no edema or skin discoloration or tenderness  Pelvic: Vulva: Normal             Vagina: No gross lesions or discharge  Cervix: No gross lesions or discharge.  Pap reflex done.  Uterus  AV, normal size, shape and consistency, non-tender and mobile  Adnexa  Without masses or tenderness  Anus: Normal  U/A: Dark yellow clear, Pro 2+, Nit Neg, WBC 0-5, RBC 3-10, Bacteria Few.  Pending U. Culture.   Assessment/Plan:  67 y.o. female for annual exam  1. Encounter for routine gynecological examination with Papanicolaou smear of cervix Postmenopause, well on no HRT.  No PMB.  No pelvic pain.  Abstinent. Pap 05/2020 Negative. Pap reflex today.  Urine and bowel movements normal.  Recurrent Cystitis, usually asymptomatic.  Will do U/A today. Breasts normal.  Screening mammo neg 03/2022.  Body mass index 42.94. Walking.  Improving diet.  Health labs with family physician. Colono 07/2020.  BD scheduled in 08/2022. - Cytology - PAP( [place])  2. Postmenopausal Postmenopause, well on no HRT.  No PMB.  No pelvic pain.  Abstinent.   3. Recurrent UTI Recurrent Cystitis, usually asymptomatic.  U/A  mildly perturbed, will wait on U. Culture to decide on treatment.  Patient followed by Urology. - Urinalysis,Complete w/RFL Culture  4. Class 3 severe obesity due to excess calories without serious comorbidity with body mass index (BMI) of 40.0 to 44.9 in adult [facility])  Other orders - Vitamin D, Ergocalciferol, (DRISDOL) 1.25 MG (50000 UNIT) CAPS capsule; Take 50,000 Units by mouth once a week. - cetirizine (ZYRTEC) 10 MG tablet; Take 10 mg by mouth daily.   Princess Bruins MD, 10:44 AM

## 2022-06-02 LAB — URINALYSIS, COMPLETE W/RFL CULTURE
Bilirubin Urine: NEGATIVE
Casts: NONE SEEN /LPF
Crystals: NONE SEEN " /HPF"
Glucose, UA: NEGATIVE
Hyaline Cast: NONE SEEN /LPF
Ketones, ur: NEGATIVE
Nitrites, Initial: NEGATIVE
Specific Gravity, Urine: 1.02 (ref 1.001–1.035)
Yeast: NONE SEEN /HPF
pH: 5.5 (ref 5.0–8.0)

## 2022-06-02 LAB — URINE CULTURE
MICRO NUMBER:: 14754668
SPECIMEN QUALITY:: ADEQUATE

## 2022-06-02 LAB — CULTURE INDICATED

## 2022-06-04 LAB — CYTOLOGY - PAP: Diagnosis: NEGATIVE

## 2022-06-12 DIAGNOSIS — I10 Essential (primary) hypertension: Secondary | ICD-10-CM | POA: Diagnosis not present

## 2022-06-12 DIAGNOSIS — N1831 Chronic kidney disease, stage 3a: Secondary | ICD-10-CM | POA: Diagnosis not present

## 2022-06-12 DIAGNOSIS — D696 Thrombocytopenia, unspecified: Secondary | ICD-10-CM | POA: Diagnosis not present

## 2022-06-12 DIAGNOSIS — R7303 Prediabetes: Secondary | ICD-10-CM | POA: Diagnosis not present

## 2022-06-12 DIAGNOSIS — E785 Hyperlipidemia, unspecified: Secondary | ICD-10-CM | POA: Diagnosis not present

## 2022-06-12 DIAGNOSIS — I700 Atherosclerosis of aorta: Secondary | ICD-10-CM | POA: Diagnosis not present

## 2022-06-12 DIAGNOSIS — E559 Vitamin D deficiency, unspecified: Secondary | ICD-10-CM | POA: Diagnosis not present

## 2022-06-12 DIAGNOSIS — G4733 Obstructive sleep apnea (adult) (pediatric): Secondary | ICD-10-CM | POA: Diagnosis not present

## 2022-06-12 DIAGNOSIS — Z9989 Dependence on other enabling machines and devices: Secondary | ICD-10-CM | POA: Diagnosis not present

## 2022-06-24 DIAGNOSIS — G4733 Obstructive sleep apnea (adult) (pediatric): Secondary | ICD-10-CM | POA: Diagnosis not present

## 2022-07-24 DIAGNOSIS — G4733 Obstructive sleep apnea (adult) (pediatric): Secondary | ICD-10-CM | POA: Diagnosis not present

## 2022-07-26 DIAGNOSIS — G4733 Obstructive sleep apnea (adult) (pediatric): Secondary | ICD-10-CM | POA: Diagnosis not present

## 2022-08-02 DIAGNOSIS — G4733 Obstructive sleep apnea (adult) (pediatric): Secondary | ICD-10-CM | POA: Diagnosis not present

## 2022-08-02 DIAGNOSIS — I10 Essential (primary) hypertension: Secondary | ICD-10-CM | POA: Diagnosis not present

## 2022-08-10 ENCOUNTER — Ambulatory Visit
Admission: RE | Admit: 2022-08-10 | Discharge: 2022-08-10 | Disposition: A | Discharge status: Home / Self Care | Payer: 59 | Source: Ambulatory Visit | Attending: Internal Medicine | Admitting: Internal Medicine

## 2022-08-10 DIAGNOSIS — M810 Age-related osteoporosis without current pathological fracture: Secondary | ICD-10-CM | POA: Diagnosis not present

## 2022-08-10 DIAGNOSIS — E349 Endocrine disorder, unspecified: Secondary | ICD-10-CM | POA: Diagnosis not present

## 2022-08-10 DIAGNOSIS — E2839 Other primary ovarian failure: Secondary | ICD-10-CM

## 2022-08-15 DIAGNOSIS — R8271 Bacteriuria: Secondary | ICD-10-CM | POA: Diagnosis not present

## 2022-08-15 DIAGNOSIS — R3121 Asymptomatic microscopic hematuria: Secondary | ICD-10-CM | POA: Diagnosis not present

## 2022-08-15 DIAGNOSIS — N3021 Other chronic cystitis with hematuria: Secondary | ICD-10-CM | POA: Diagnosis not present

## 2022-08-24 DIAGNOSIS — G4733 Obstructive sleep apnea (adult) (pediatric): Secondary | ICD-10-CM | POA: Diagnosis not present

## 2022-09-23 DIAGNOSIS — G4733 Obstructive sleep apnea (adult) (pediatric): Secondary | ICD-10-CM | POA: Diagnosis not present

## 2022-10-24 DIAGNOSIS — G4733 Obstructive sleep apnea (adult) (pediatric): Secondary | ICD-10-CM | POA: Diagnosis not present

## 2023-01-14 ENCOUNTER — Other Ambulatory Visit: Payer: Self-pay | Admitting: Internal Medicine

## 2023-01-14 DIAGNOSIS — Z1231 Encounter for screening mammogram for malignant neoplasm of breast: Secondary | ICD-10-CM

## 2023-01-16 DIAGNOSIS — E559 Vitamin D deficiency, unspecified: Secondary | ICD-10-CM | POA: Diagnosis not present

## 2023-01-16 DIAGNOSIS — E785 Hyperlipidemia, unspecified: Secondary | ICD-10-CM | POA: Diagnosis not present

## 2023-01-16 DIAGNOSIS — N182 Chronic kidney disease, stage 2 (mild): Secondary | ICD-10-CM | POA: Diagnosis not present

## 2023-01-16 DIAGNOSIS — Z1389 Encounter for screening for other disorder: Secondary | ICD-10-CM | POA: Diagnosis not present

## 2023-01-16 DIAGNOSIS — Z79899 Other long term (current) drug therapy: Secondary | ICD-10-CM | POA: Diagnosis not present

## 2023-01-16 DIAGNOSIS — I10 Essential (primary) hypertension: Secondary | ICD-10-CM | POA: Diagnosis not present

## 2023-01-16 DIAGNOSIS — Z0000 Encounter for general adult medical examination without abnormal findings: Secondary | ICD-10-CM | POA: Diagnosis not present

## 2023-01-16 DIAGNOSIS — G4733 Obstructive sleep apnea (adult) (pediatric): Secondary | ICD-10-CM | POA: Diagnosis not present

## 2023-01-16 DIAGNOSIS — Z9989 Dependence on other enabling machines and devices: Secondary | ICD-10-CM | POA: Diagnosis not present

## 2023-01-16 DIAGNOSIS — I700 Atherosclerosis of aorta: Secondary | ICD-10-CM | POA: Diagnosis not present

## 2023-01-16 DIAGNOSIS — R7303 Prediabetes: Secondary | ICD-10-CM | POA: Diagnosis not present

## 2023-02-14 DIAGNOSIS — G4733 Obstructive sleep apnea (adult) (pediatric): Secondary | ICD-10-CM | POA: Diagnosis not present

## 2023-03-11 ENCOUNTER — Ambulatory Visit: Payer: Medicare Other

## 2023-03-28 ENCOUNTER — Ambulatory Visit
Admission: RE | Admit: 2023-03-28 | Discharge: 2023-03-28 | Disposition: A | Discharge status: Home / Self Care | Payer: Medicare Other | Source: Ambulatory Visit | Attending: Internal Medicine | Admitting: Internal Medicine

## 2023-03-28 DIAGNOSIS — Z1231 Encounter for screening mammogram for malignant neoplasm of breast: Secondary | ICD-10-CM | POA: Diagnosis not present

## 2023-07-23 DIAGNOSIS — G4733 Obstructive sleep apnea (adult) (pediatric): Secondary | ICD-10-CM | POA: Diagnosis not present

## 2023-07-23 DIAGNOSIS — I10 Essential (primary) hypertension: Secondary | ICD-10-CM | POA: Diagnosis not present

## 2023-07-23 DIAGNOSIS — E785 Hyperlipidemia, unspecified: Secondary | ICD-10-CM | POA: Diagnosis not present

## 2023-07-23 DIAGNOSIS — D696 Thrombocytopenia, unspecified: Secondary | ICD-10-CM | POA: Diagnosis not present

## 2023-07-23 DIAGNOSIS — N182 Chronic kidney disease, stage 2 (mild): Secondary | ICD-10-CM | POA: Diagnosis not present

## 2023-07-23 DIAGNOSIS — R7303 Prediabetes: Secondary | ICD-10-CM | POA: Diagnosis not present

## 2023-07-23 DIAGNOSIS — I700 Atherosclerosis of aorta: Secondary | ICD-10-CM | POA: Diagnosis not present

## 2023-08-16 DIAGNOSIS — N3021 Other chronic cystitis with hematuria: Secondary | ICD-10-CM | POA: Diagnosis not present

## 2023-08-16 DIAGNOSIS — R8271 Bacteriuria: Secondary | ICD-10-CM | POA: Diagnosis not present

## 2024-02-04 ENCOUNTER — Other Ambulatory Visit: Payer: Self-pay | Admitting: Internal Medicine

## 2024-02-04 DIAGNOSIS — Z1231 Encounter for screening mammogram for malignant neoplasm of breast: Secondary | ICD-10-CM

## 2024-03-30 ENCOUNTER — Ambulatory Visit

## 2024-04-09 ENCOUNTER — Ambulatory Visit
Admission: RE | Admit: 2024-04-09 | Discharge: 2024-04-09 | Disposition: A | Source: Ambulatory Visit | Attending: Internal Medicine | Admitting: Internal Medicine

## 2024-04-09 DIAGNOSIS — Z1231 Encounter for screening mammogram for malignant neoplasm of breast: Secondary | ICD-10-CM

## 2024-06-01 ENCOUNTER — Ambulatory Visit: Admitting: Obstetrics and Gynecology
